# Patient Record
Sex: Male | Born: 1960
Health system: Southern US, Community
[De-identification: ages and names within clinical notes are randomized; demographics above are authoritative.]

## PROBLEM LIST (undated history)

## (undated) DIAGNOSIS — U071 COVID-19: Secondary | ICD-10-CM

## (undated) DIAGNOSIS — E669 Obesity, unspecified: Secondary | ICD-10-CM

## (undated) DIAGNOSIS — R0600 Dyspnea, unspecified: Secondary | ICD-10-CM

## (undated) DIAGNOSIS — R7303 Prediabetes: Secondary | ICD-10-CM

## (undated) DIAGNOSIS — F429 Obsessive-compulsive disorder, unspecified: Secondary | ICD-10-CM

## (undated) DIAGNOSIS — N200 Calculus of kidney: Secondary | ICD-10-CM

## (undated) DIAGNOSIS — E7800 Pure hypercholesterolemia, unspecified: Secondary | ICD-10-CM

## (undated) DIAGNOSIS — I10 Essential (primary) hypertension: Secondary | ICD-10-CM

## (undated) DIAGNOSIS — R748 Abnormal levels of other serum enzymes: Secondary | ICD-10-CM

## (undated) DIAGNOSIS — F909 Attention-deficit hyperactivity disorder, unspecified type: Secondary | ICD-10-CM

## (undated) DIAGNOSIS — N289 Disorder of kidney and ureter, unspecified: Secondary | ICD-10-CM

## (undated) DIAGNOSIS — G4730 Sleep apnea, unspecified: Secondary | ICD-10-CM

## (undated) DIAGNOSIS — R600 Localized edema: Secondary | ICD-10-CM

## (undated) HISTORY — DX: Prediabetes: R73.03

## (undated) HISTORY — PX: HERNIA REPAIR: SHX51

## (undated) HISTORY — DX: COVID-19: U07.1

## (undated) HISTORY — DX: Abnormal levels of other serum enzymes: R74.8

## (undated) HISTORY — DX: Obsessive-compulsive disorder, unspecified: F42.9

## (undated) HISTORY — DX: Obesity, unspecified: E66.9

## (undated) HISTORY — DX: Calculus of kidney: N20.0

## (undated) HISTORY — DX: Localized edema: R60.0

## (undated) HISTORY — DX: Dyspnea, unspecified: R06.00

---

## 2000-05-27 ENCOUNTER — Observation Stay (HOSPITAL_COMMUNITY): Admission: EM | Admit: 2000-05-27 | Discharge: 2000-05-28 | Payer: Self-pay | Admitting: Emergency Medicine

## 2000-05-27 ENCOUNTER — Encounter: Payer: Self-pay | Admitting: Emergency Medicine

## 2000-05-28 ENCOUNTER — Encounter: Payer: Self-pay | Admitting: Cardiology

## 2001-09-05 ENCOUNTER — Emergency Department (HOSPITAL_COMMUNITY): Admission: EM | Admit: 2001-09-05 | Discharge: 2001-09-05 | Payer: Self-pay | Admitting: Emergency Medicine

## 2001-09-05 ENCOUNTER — Encounter: Payer: Self-pay | Admitting: Emergency Medicine

## 2003-02-19 ENCOUNTER — Ambulatory Visit (HOSPITAL_COMMUNITY): Admission: RE | Admit: 2003-02-19 | Discharge: 2003-02-19 | Payer: Self-pay | Admitting: General Surgery

## 2005-06-14 ENCOUNTER — Ambulatory Visit: Payer: Self-pay | Admitting: Family Medicine

## 2007-01-07 ENCOUNTER — Ambulatory Visit: Payer: Self-pay | Admitting: Family Medicine

## 2014-09-06 ENCOUNTER — Encounter (HOSPITAL_BASED_OUTPATIENT_CLINIC_OR_DEPARTMENT_OTHER): Payer: Self-pay | Admitting: *Deleted

## 2014-09-08 NOTE — H&P (Signed)
  [facility] [address] [place],  [address] [phone] A Division of [facility]  Ninetta Lights, M.D.   Robert A. Noemi Chapel, M.D.   Faythe Casa, M.D.   Johnny Bridge, M.D.   Almedia Balls, M.D Ernesta Amble. Percell Miller, M.D.  Joseph Pierini, M.D.  Lanier Prude, M.D.    Verner Chol, M.D. Mary L. Fenton Malling, PA-C  Kirstin A. Shepperson, PA-C  Josh Warthen, PA-C [place], [place]   RE: Scott Wolfe, Scott Wolfe   [id number]      DOB: July 15, 1961 PROGRESS NOTE: 08-23-14 Reason for visit:  Follow-up left knee injury. History of present illness: In July he was trail runner and had a misstep and has had occasional swelling pain and pinching of his knee since then. Dr. Lynann Bologna obtained an MRI which demonstrated posterior horn medial meniscus tear that has a flap fragment that is displaced. 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   Please see associated documentation for this clinic visit for further past medical, family, surgical and social history, review of systems, and exam findings as this was reviewed by me.  EXAMINATION: Well appearing male in no apparent distress. He has tenderness in the left knee medial joint line stable to ligament exam mild effusion.   IMAGING: MRI results above.  ASSESSMENT/PLAN: 1. He has a posterior medial meniscus tear. 2. We will set him up for partial meniscectomy and possible chondroplasty.  Ernesta Amble.  Percell Miller, M.D.  Electronically verified by Ernesta Amble. Percell Miller, M.D. TDM:kah D 08-23-14 T 08-25-14

## 2014-09-09 ENCOUNTER — Ambulatory Visit (HOSPITAL_BASED_OUTPATIENT_CLINIC_OR_DEPARTMENT_OTHER)
Admission: RE | Admit: 2014-09-09 | Discharge: 2014-09-09 | Disposition: A | Discharge status: Home / Self Care | Payer: 59 | Source: Ambulatory Visit | Attending: Orthopedic Surgery | Admitting: Orthopedic Surgery

## 2014-09-09 DIAGNOSIS — F909 Attention-deficit hyperactivity disorder, unspecified type: Secondary | ICD-10-CM | POA: Diagnosis not present

## 2014-09-09 DIAGNOSIS — G4730 Sleep apnea, unspecified: Secondary | ICD-10-CM | POA: Diagnosis not present

## 2014-09-09 DIAGNOSIS — S8992XA Unspecified injury of left lower leg, initial encounter: Secondary | ICD-10-CM | POA: Diagnosis not present

## 2014-09-09 DIAGNOSIS — E780 Pure hypercholesterolemia: Secondary | ICD-10-CM | POA: Diagnosis not present

## 2014-09-09 DIAGNOSIS — Z6839 Body mass index (BMI) 39.0-39.9, adult: Secondary | ICD-10-CM | POA: Diagnosis not present

## 2014-09-09 DIAGNOSIS — M2242 Chondromalacia patellae, left knee: Secondary | ICD-10-CM | POA: Diagnosis not present

## 2014-09-09 DIAGNOSIS — S83242A Other tear of medial meniscus, current injury, left knee, initial encounter: Secondary | ICD-10-CM | POA: Diagnosis present

## 2014-09-09 DIAGNOSIS — I10 Essential (primary) hypertension: Secondary | ICD-10-CM | POA: Diagnosis not present

## 2014-09-09 LAB — BASIC METABOLIC PANEL
Anion gap: 7 (ref 5–15)
BUN: 18 mg/dL (ref 6–23)
CALCIUM: 9.1 mg/dL (ref 8.4–10.5)
CO2: 26 mmol/L (ref 19–32)
Chloride: 106 mEq/L (ref 96–112)
Creatinine, Ser: 1.12 mg/dL (ref 0.50–1.35)
GFR calc Af Amer: 84 mL/min — ABNORMAL LOW (ref >90)
GFR calc non Af Amer: 73 mL/min — ABNORMAL LOW (ref >90)
GLUCOSE: 89 mg/dL (ref 70–99)
Potassium: 4.6 mmol/L (ref 3.5–5.1)
Sodium: 139 mmol/L (ref 135–145)

## 2014-09-10 ENCOUNTER — Ambulatory Visit (HOSPITAL_BASED_OUTPATIENT_CLINIC_OR_DEPARTMENT_OTHER): Payer: 59 | Admitting: Anesthesiology

## 2014-09-10 ENCOUNTER — Ambulatory Visit (HOSPITAL_BASED_OUTPATIENT_CLINIC_OR_DEPARTMENT_OTHER)
Admission: RE | Admit: 2014-09-10 | Discharge: 2014-09-10 | Disposition: A | Discharge status: Home / Self Care | Payer: 59 | Source: Ambulatory Visit | Attending: Orthopedic Surgery | Admitting: Orthopedic Surgery

## 2014-09-10 ENCOUNTER — Encounter (HOSPITAL_BASED_OUTPATIENT_CLINIC_OR_DEPARTMENT_OTHER)
Admission: RE | Disposition: A | Discharge status: Home / Self Care | Payer: Self-pay | Source: Ambulatory Visit | Attending: Orthopedic Surgery

## 2014-09-10 ENCOUNTER — Encounter (HOSPITAL_BASED_OUTPATIENT_CLINIC_OR_DEPARTMENT_OTHER): Payer: Self-pay | Admitting: Anesthesiology

## 2014-09-10 DIAGNOSIS — F909 Attention-deficit hyperactivity disorder, unspecified type: Secondary | ICD-10-CM | POA: Insufficient documentation

## 2014-09-10 DIAGNOSIS — Z6839 Body mass index (BMI) 39.0-39.9, adult: Secondary | ICD-10-CM | POA: Insufficient documentation

## 2014-09-10 DIAGNOSIS — M2242 Chondromalacia patellae, left knee: Secondary | ICD-10-CM | POA: Insufficient documentation

## 2014-09-10 DIAGNOSIS — S8992XA Unspecified injury of left lower leg, initial encounter: Secondary | ICD-10-CM | POA: Insufficient documentation

## 2014-09-10 DIAGNOSIS — I10 Essential (primary) hypertension: Secondary | ICD-10-CM | POA: Insufficient documentation

## 2014-09-10 DIAGNOSIS — S83242A Other tear of medial meniscus, current injury, left knee, initial encounter: Secondary | ICD-10-CM | POA: Insufficient documentation

## 2014-09-10 DIAGNOSIS — G4730 Sleep apnea, unspecified: Secondary | ICD-10-CM | POA: Insufficient documentation

## 2014-09-10 DIAGNOSIS — E780 Pure hypercholesterolemia: Secondary | ICD-10-CM | POA: Insufficient documentation

## 2014-09-10 HISTORY — DX: Essential (primary) hypertension: I10

## 2014-09-10 HISTORY — PX: KNEE ARTHROSCOPY WITH MEDIAL MENISECTOMY: SHX5651

## 2014-09-10 HISTORY — DX: Pure hypercholesterolemia, unspecified: E78.00

## 2014-09-10 HISTORY — DX: Sleep apnea, unspecified: G47.30

## 2014-09-10 HISTORY — DX: Attention-deficit hyperactivity disorder, unspecified type: F90.9

## 2014-09-10 LAB — POCT HEMOGLOBIN-HEMACUE: Hemoglobin: 17.4 g/dL — ABNORMAL HIGH (ref 13.0–17.0)

## 2014-09-10 SURGERY — ARTHROSCOPY, KNEE, WITH MEDIAL MENISCECTOMY
Anesthesia: General | Site: Knee | Laterality: Left

## 2014-09-10 MED ORDER — CEFAZOLIN SODIUM 10 G IJ SOLR: 3.0000 g | INTRAMUSCULAR | Status: DC

## 2014-09-10 MED ORDER — HYDROMORPHONE HCL 1 MG/ML IJ SOLN: 0.2500 mg | INTRAMUSCULAR | Status: DC | PRN

## 2014-09-10 MED ORDER — FENTANYL CITRATE 0.05 MG/ML IJ SOLN
50.0000 ug | INTRAMUSCULAR | Status: DC | PRN
  Administered 2014-09-10: 100 ug via INTRAVENOUS

## 2014-09-10 MED ORDER — OXYCODONE HCL 5 MG PO TABS
ORAL_TABLET | ORAL | Status: AC
  Filled 2014-09-10: qty 1

## 2014-09-10 MED ORDER — CEFAZOLIN SODIUM-DEXTROSE 2-3 GM-% IV SOLR: INTRAVENOUS | Status: DC | PRN

## 2014-09-10 MED ORDER — ACETAMINOPHEN 500 MG PO TABS
1000.0000 mg | ORAL_TABLET | Freq: Once | ORAL | Status: AC
  Administered 2014-09-10: 1000 mg via ORAL

## 2014-09-10 MED ORDER — LIDOCAINE HCL (CARDIAC) 20 MG/ML IV SOLN
INTRAVENOUS | Status: DC | PRN
  Administered 2014-09-10: 80 mg via INTRAVENOUS

## 2014-09-10 MED ORDER — CEFAZOLIN SODIUM-DEXTROSE 2-3 GM-% IV SOLR
INTRAVENOUS | Status: DC | PRN
  Administered 2014-09-10: 3 g via INTRAVENOUS

## 2014-09-10 MED ORDER — MIDAZOLAM HCL 2 MG/2ML IJ SOLN
1.0000 mg | INTRAMUSCULAR | Status: DC | PRN
  Administered 2014-09-10: 2 mg via INTRAVENOUS

## 2014-09-10 MED ORDER — FENTANYL CITRATE 0.05 MG/ML IJ SOLN
INTRAMUSCULAR | Status: AC
  Filled 2014-09-10: qty 2

## 2014-09-10 MED ORDER — METHYLPREDNISOLONE ACETATE 80 MG/ML IJ SUSP
INTRAMUSCULAR | Status: DC | PRN
  Administered 2014-09-10: 80 mg

## 2014-09-10 MED ORDER — CEFAZOLIN SODIUM-DEXTROSE 2-3 GM-% IV SOLR
INTRAVENOUS | Status: AC
  Filled 2014-09-10: qty 50

## 2014-09-10 MED ORDER — DOCUSATE SODIUM 100 MG PO CAPS: 100.0000 mg | ORAL_CAPSULE | Freq: Two times a day (BID) | ORAL | Status: DC

## 2014-09-10 MED ORDER — ACETAMINOPHEN 500 MG PO TABS
ORAL_TABLET | ORAL | Status: AC
  Filled 2014-09-10: qty 2

## 2014-09-10 MED ORDER — CEFAZOLIN SODIUM 1-5 GM-% IV SOLN
INTRAVENOUS | Status: AC
  Filled 2014-09-10: qty 50

## 2014-09-10 MED ORDER — ONDANSETRON HCL 4 MG PO TABS: 4.0000 mg | ORAL_TABLET | Freq: Three times a day (TID) | ORAL | Status: DC | PRN

## 2014-09-10 MED ORDER — MIDAZOLAM HCL 5 MG/5ML IJ SOLN
INTRAMUSCULAR | Status: DC | PRN
  Administered 2014-09-10: 2 mg via INTRAVENOUS

## 2014-09-10 MED ORDER — MIDAZOLAM HCL 2 MG/2ML IJ SOLN
INTRAMUSCULAR | Status: AC
Start: 2014-09-10 — End: 2014-09-10
  Filled 2014-09-10: qty 2

## 2014-09-10 MED ORDER — FENTANYL CITRATE 0.05 MG/ML IJ SOLN
INTRAMUSCULAR | Status: AC
  Filled 2014-09-10: qty 6

## 2014-09-10 MED ORDER — BUPIVACAINE HCL (PF) 0.25 % IJ SOLN
INTRAMUSCULAR | Status: DC | PRN
  Administered 2014-09-10: 5 mL

## 2014-09-10 MED ORDER — OXYCODONE-ACETAMINOPHEN 5-325 MG PO TABS: 2.0000 | ORAL_TABLET | ORAL | Status: DC | PRN

## 2014-09-10 MED ORDER — OXYCODONE HCL 5 MG PO TABS
5.0000 mg | ORAL_TABLET | Freq: Once | ORAL | Status: AC | PRN
  Administered 2014-09-10: 5 mg via ORAL

## 2014-09-10 MED ORDER — DEXAMETHASONE SODIUM PHOSPHATE 10 MG/ML IJ SOLN
INTRAMUSCULAR | Status: DC | PRN
  Administered 2014-09-10: 10 mg via INTRAVENOUS

## 2014-09-10 MED ORDER — FENTANYL CITRATE 0.05 MG/ML IJ SOLN
INTRAMUSCULAR | Status: DC | PRN
  Administered 2014-09-10 (×2): 50 ug via INTRAVENOUS
  Administered 2014-09-10: 25 ug via INTRAVENOUS

## 2014-09-10 MED ORDER — OXYCODONE HCL 5 MG/5ML PO SOLN: 5.0000 mg | Freq: Once | ORAL | Status: AC | PRN

## 2014-09-10 MED ORDER — MIDAZOLAM HCL 2 MG/2ML IJ SOLN
INTRAMUSCULAR | Status: AC
  Filled 2014-09-10: qty 2

## 2014-09-10 MED ORDER — ASPIRIN 81 MG PO TABS: 81.0000 mg | ORAL_TABLET | Freq: Every day | ORAL | Status: AC

## 2014-09-10 MED ORDER — LACTATED RINGERS IV SOLN
INTRAVENOUS | Status: DC
  Administered 2014-09-10: 14:00:00 via INTRAVENOUS

## 2014-09-10 MED ORDER — DEXTROSE-NACL 5-0.45 % IV SOLN: 100.0000 mL/h | INTRAVENOUS | Status: DC

## 2014-09-10 MED ORDER — PROPOFOL 10 MG/ML IV BOLUS
INTRAVENOUS | Status: DC | PRN
  Administered 2014-09-10: 200 mg via INTRAVENOUS

## 2014-09-10 SURGICAL SUPPLY — 39 items
BANDAGE ELASTIC 6 VELCRO ST LF (GAUZE/BANDAGES/DRESSINGS) ×2 IMPLANT
BLADE CUDA 5.5 (BLADE) IMPLANT
BLADE CUDA GRT WHITE 3.5 (BLADE) IMPLANT
BLADE CUTTER GATOR 3.5 (BLADE) ×2 IMPLANT
BLADE CUTTER MENIS 5.5 (BLADE) IMPLANT
CANISTER SUCT 3000ML (MISCELLANEOUS) IMPLANT
CHLORAPREP W/TINT 26ML (MISCELLANEOUS) ×2 IMPLANT
CUTTER MENISCUS  4.2MM (BLADE)
CUTTER MENISCUS 4.2MM (BLADE) IMPLANT
DRAPE ARTHROSCOPY W/POUCH 90 (DRAPES) ×2 IMPLANT
DRAPE U 20/CS (DRAPES) ×2 IMPLANT
DRAPE U-SHAPE 47X51 STRL (DRAPES) ×2 IMPLANT
DRSG EMULSION OIL 3X3 NADH (GAUZE/BANDAGES/DRESSINGS) ×2 IMPLANT
GAUZE SPONGE 4X4 12PLY STRL (GAUZE/BANDAGES/DRESSINGS) ×3 IMPLANT
GLOVE BIO SURGEON STRL SZ 6.5 (GLOVE) ×1 IMPLANT
GLOVE BIO SURGEON STRL SZ7.5 (GLOVE) ×2 IMPLANT
GLOVE BIO SURGEON STRL SZ8 (GLOVE) IMPLANT
GLOVE BIOGEL PI IND STRL 7.0 (GLOVE) IMPLANT
GLOVE BIOGEL PI IND STRL 8 (GLOVE) ×1 IMPLANT
GLOVE BIOGEL PI IND STRL 8.5 (GLOVE) IMPLANT
GLOVE BIOGEL PI INDICATOR 7.0 (GLOVE) ×2
GLOVE BIOGEL PI INDICATOR 8 (GLOVE) ×1
GLOVE BIOGEL PI INDICATOR 8.5 (GLOVE)
GOWN STRL REUS W/ TWL LRG LVL3 (GOWN DISPOSABLE) ×3 IMPLANT
GOWN STRL REUS W/ TWL XL LVL3 (GOWN DISPOSABLE) ×1 IMPLANT
GOWN STRL REUS W/TWL LRG LVL3 (GOWN DISPOSABLE) ×2
GOWN STRL REUS W/TWL XL LVL3 (GOWN DISPOSABLE) ×2
KNEE WRAP E Z 3 GEL PACK (MISCELLANEOUS) ×1 IMPLANT
MANIFOLD NEPTUNE II (INSTRUMENTS) ×2 IMPLANT
PACK ARTHROSCOPY DSU (CUSTOM PROCEDURE TRAY) ×2 IMPLANT
PACK BASIN DAY SURGERY FS (CUSTOM PROCEDURE TRAY) ×2 IMPLANT
SET ARTHROSCOPY TUBING (MISCELLANEOUS) ×2
SET ARTHROSCOPY TUBING LN (MISCELLANEOUS) ×1 IMPLANT
SUT ETHILON 3 0 PS 1 (SUTURE) ×2 IMPLANT
TAPE CLOTH 3X10 TAN LF (GAUZE/BANDAGES/DRESSINGS) ×1 IMPLANT
TOWEL OR 17X24 6PK STRL BLUE (TOWEL DISPOSABLE) ×2 IMPLANT
TOWEL OR NON WOVEN STRL DISP B (DISPOSABLE) ×2 IMPLANT
WAND STAR VAC 90 (SURGICAL WAND) IMPLANT
WATER STERILE IRR 1000ML POUR (IV SOLUTION) ×2 IMPLANT

## 2014-09-10 NOTE — Discharge Instructions (Signed)
Remove dressings on Monday and ok to shower then, cover with bandaids after  Bear weight as tolerated   Post Anesthesia Home Care Instructions  Activity: Get plenty of rest for the remainder of the day. A responsible adult should stay with you for 24 hours following the procedure.  For the next 24 hours, DO NOT: -Drive a car -Paediatric nurse -Drink alcoholic beverages -Take any medication unless instructed by your physician -Make any legal decisions or sign important papers.  Meals: Start with liquid foods such as gelatin or soup. Progress to regular foods as tolerated. Avoid greasy, spicy, heavy foods. If nausea and/or vomiting occur, drink only clear liquids until the nausea and/or vomiting subsides. Call your physician if vomiting continues.  Special Instructions/Symptoms: Your throat may feel dry or sore from the anesthesia or the breathing tube placed in your throat during surgery. If this causes discomfort, gargle with warm salt water. The discomfort should disappear within 24 hours.   Regional Anesthesia Blocks  1. Numbness or the inability to move the "blocked" extremity may last from 3-48 hours after placement. The length of time depends on the medication injected and your individual response to the medication. If the numbness is not going away after 48 hours, call your surgeon.  2. The extremity that is blocked will need to be protected until the numbness is gone and the  Strength has returned. Because you cannot feel it, you will need to take extra care to avoid injury. Because it may be weak, you may have difficulty moving it or using it. You may not know what position it is in without looking at it while the block is in effect.  3. For blocks in the legs and feet, returning to weight bearing and walking needs to be done carefully. You will need to wait until the numbness is entirely gone and the strength has returned. You should be able to move your leg and foot normally  before you try and bear weight or walk. You will need someone to be with you when you first try to ensure you do not fall and possibly risk injury.  4. Bruising and tenderness at the needle site are common side effects and will resolve in a few days.  5. Persistent numbness or new problems with movement should be communicated to the surgeon or the [place] [phone] [place] [phone]).  Call your surgeon if you experience:   1.  Fever over 101.0. 2.  Inability to urinate. 3.  Nausea and/or vomiting. 4.  Extreme swelling or bruising at the surgical site. 5.  Continued bleeding from the incision. 6.  Increased pain, redness or drainage from the incision. 7.  Problems related to your pain medication. 8. Any change in color, movement and/or sensation 9. Any problems and/or concerns

## 2014-09-10 NOTE — Op Note (Signed)
09/10/2014  4:02 PM  PATIENT:  Scott Wolfe    PRE-OPERATIVE DIAGNOSIS:  chondromalcia patellae, left knee, other tear of medial meniscus, current injury, left knee  POST-OPERATIVE DIAGNOSIS:  Same  PROCEDURE:  LEFT KNEE ARTHROSCOPY WITH DEBRIDEMENT AND  MEDIAL MENISECTOMY  SURGEON:  Tycho Cheramie, D, MD  ASSISTANT: None  ANESTHESIA:   General  BLOOD LOSS: min  COMPLICATIONS: None   PREOPERATIVE INDICATIONS:  [name] is a  54 y.o. male with a diagnosis of chondromalcia patellae, left knee, other tear of medial meniscus, current injury, left knee who failed conservative measures and elected for surgical management.    The risks benefits and alternatives were discussed with the patient preoperatively including but not limited to the risks of infection, bleeding, nerve injury, cardiopulmonary complications, the need for revision surgery, among others, and the patient was willing to proceed.  OPERATIVE IMPLANTS: none  OPERATIVE FINDINGS: Examination under anesthesia: stable Diagnostic Arthroscopy:  articular cartilage:grad 2 PF and Medial  Medial meniscus:2 parrot beak tears Lateral meniscus:stable Anterior cruciate ligament/PCL: intact Loose bodies: none    OPERATIVE PROCEDURE:  Patient was identified in the preoperative holding area and site was marked by me male was transported to the operating theater and placed on the table in supine position taking care to pad all bony prominences. After a preincinduction time out anesthesia was induced.  male received ancef for preoperative antibiotics. The left extremity was prepped and draped in normal sterile fashion and a pre-incision timeout was performed.   A small stab incision was made in the anterolateral portal position. The arthroscope was introduced in the joint. A medial portal was then established under direct visualization just above the anterior horn of the medial meniscus. Diagnostic arthroscopy was then carried out  with findings as described above.  I performed a chondroplasty at the patellofemoral space there was grade 2 chondral changes. In the notch  In the notch anterior cruciate ligament and PCL were without pathology.  On the lateral side meniscus was intact grade 1 changes to the cartilage. On the medial side there was an anterior parrot-beak tear that I debrided to a stable rim there were grade 2 changes throughout the tibia I performed a chondroplasty here. The posterior at the posterior horn there was a large parrot beak tear that was displaceable I debrided this back to a stable rim.    The arthroscopic equipment was removed from the joint and the portals were closed with 3-0 nylon in an interrupted fashion. The knee was infiltrated with depomedrol and marcain 5cc.  Sterile dressings were then applied including Xeroform 4 x 4's ABDs an ACE bandage.  The patient was then allowed to awaken from general anesthesia, transferred to the stretcher and taken to the recovery room in stable condition.  POSTOPERATIVE PLAN: The patient will be discharged home today and will followup in one week for suture removal and wound check.  VTE prophylaxis: ambulation and ASA 81   This note was generated using a template and dragon dictation system. In light of that, I have reviewed the note and all aspects of it are applicable to this case. Any dictation errors are due to the computerized dictation system.

## 2014-09-10 NOTE — Transfer of Care (Signed)
Immediate Anesthesia Transfer of Care Note  Patient: Scott Wolfe  Procedure(s) Performed: Procedure(s): LEFT KNEE ARTHROSCOPY WITH DEBRIDEMENT AND  MEDIAL MENISECTOMY (Left)  Patient Location: PACU  Anesthesia Type:GA combined with regional for post-op pain  Level of Consciousness: awake, alert  and patient cooperative  Airway & Oxygen Therapy: Patient Spontanous Breathing and Patient connected to face mask oxygen  Post-op Assessment: Report given to PACU RN and Post -op Vital signs reviewed and stable  Post vital signs: Reviewed and stable  Complications: No apparent anesthesia complications

## 2014-09-10 NOTE — Progress Notes (Signed)
Assisted Dr. Linna Caprice with left, ultrasound guided, femoral block. Side rails up, monitors on throughout procedure. See vital signs in flow sheet. Tolerated Procedure well.

## 2014-09-10 NOTE — Anesthesia Postprocedure Evaluation (Signed)
  Anesthesia Post-op Note  Patient: Scott Wolfe  Procedure(s) Performed: Procedure(s): LEFT KNEE ARTHROSCOPY WITH DEBRIDEMENT AND  MEDIAL MENISECTOMY (Left)  Patient Location: PACU  Anesthesia Type:General and GA combined with regional for post-op pain  Level of Consciousness: awake, alert  and oriented  Airway and Oxygen Therapy: Patient Spontanous Breathing and Patient connected to nasal cannula oxygen  Post-op Pain: mild  Post-op Assessment: Post-op Vital signs reviewed, Patient's Cardiovascular Status Stable, Respiratory Function Stable, Patent Airway, No signs of Nausea or vomiting and Pain level controlled  Post-op Vital Signs: stable  Last Vitals:  Filed Vitals:   09/10/14 1656  BP: 133/80  Pulse: 81  Temp: 36.7 C  Resp: 18    Complications: No apparent anesthesia complications

## 2014-09-10 NOTE — Anesthesia Procedure Notes (Addendum)
Procedure Name: LMA Insertion Date/Time: 09/10/2014 3:17 PM Performed by: Toula Moos L Pre-anesthesia Checklist: Patient identified, Emergency Drugs available, Suction available, Patient being monitored and Timeout performed Patient Re-evaluated:Patient Re-evaluated prior to inductionOxygen Delivery Method: Circle System Utilized Preoxygenation: Pre-oxygenation with 100% oxygen Intubation Type: IV induction Ventilation: Mask ventilation without difficulty LMA: LMA inserted LMA Size: 5.0 Number of attempts: 1 Airway Equipment and Method: bite block Placement Confirmation: positive ETCO2 Tube secured with: Tape Dental Injury: Teeth and Oropharynx as per pre-operative assessment    Anesthesia Regional Block:  Adductor canal block  Pre-Anesthetic Checklist: ,, timeout performed, Correct Patient, Correct Site, Correct Laterality, Correct Procedure, Correct Position, site marked, Risks and benefits discussed,  Surgical consent,  Pre-op evaluation,  At surgeon's request and post-op pain management  Laterality: Left  Prep: chloraprep       Needles:  Injection technique: Single-shot  Needle Type: Echogenic Stimulator Needle     Needle Length: 9cm 9 cm Needle Gauge: 22 and 22 G    Additional Needles:  Procedures: ultrasound guided (picture in chart) Adductor canal block Narrative:  Start time: 09/10/2014 2:30 PM End time: 09/10/2014 2:35 PM Injection made incrementally with aspirations every 5 mL.  Performed by: Personally   Additional Notes: 30 cc 0.5% Marcaine with 1:200 Epi injected easily

## 2014-09-10 NOTE — Anesthesia Preprocedure Evaluation (Signed)
Anesthesia Evaluation  Patient identified by MRN, date of birth, ID band Patient awake    Reviewed: Allergy & Precautions, H&P , NPO status , Patient's Chart, lab work & pertinent test results  Airway        Dental no notable dental hx.    Pulmonary sleep apnea ,    Pulmonary exam normal       Cardiovascular hypertension, On Medications negative cardio ROS      Neuro/Psych negative neurological ROS     GI/Hepatic negative GI ROS, Neg liver ROS,   Endo/Other  Morbid obesity  Renal/GU negative Renal ROS  negative genitourinary   Musculoskeletal   Abdominal   Peds  (+) ADHD Hematology negative hematology ROS (+)   Anesthesia Other Findings   Reproductive/Obstetrics negative OB ROS                             Anesthesia Physical Anesthesia Plan  ASA: III  Anesthesia Plan: General   Post-op Pain Management:    Induction: Intravenous  Airway Management Planned: LMA  Additional Equipment:   Intra-op Plan:   Post-operative Plan: Extubation in OR  Informed Consent: I have reviewed the patients History and Physical, chart, labs and discussed the procedure including the risks, benefits and alternatives for the proposed anesthesia with the patient or authorized representative who has indicated his/her understanding and acceptance.   Dental advisory given  Plan Discussed with: CRNA  Anesthesia Plan Comments:         Anesthesia Quick Evaluation

## 2014-09-13 ENCOUNTER — Encounter (HOSPITAL_BASED_OUTPATIENT_CLINIC_OR_DEPARTMENT_OTHER): Payer: Self-pay | Admitting: Orthopedic Surgery

## 2017-01-20 ENCOUNTER — Encounter (HOSPITAL_COMMUNITY): Payer: Self-pay | Admitting: Emergency Medicine

## 2017-01-20 ENCOUNTER — Emergency Department (HOSPITAL_COMMUNITY): Payer: Commercial Managed Care - HMO

## 2017-01-20 ENCOUNTER — Emergency Department (HOSPITAL_COMMUNITY)
Admission: EM | Admit: 2017-01-20 | Discharge: 2017-01-20 | Disposition: A | Discharge status: Home / Self Care | Payer: Commercial Managed Care - HMO | Attending: Emergency Medicine | Admitting: Emergency Medicine

## 2017-01-20 DIAGNOSIS — I10 Essential (primary) hypertension: Secondary | ICD-10-CM | POA: Diagnosis not present

## 2017-01-20 DIAGNOSIS — N132 Hydronephrosis with renal and ureteral calculous obstruction: Secondary | ICD-10-CM | POA: Diagnosis not present

## 2017-01-20 DIAGNOSIS — Z79899 Other long term (current) drug therapy: Secondary | ICD-10-CM | POA: Insufficient documentation

## 2017-01-20 DIAGNOSIS — N1330 Unspecified hydronephrosis: Secondary | ICD-10-CM | POA: Diagnosis not present

## 2017-01-20 DIAGNOSIS — R109 Unspecified abdominal pain: Secondary | ICD-10-CM

## 2017-01-20 DIAGNOSIS — N201 Calculus of ureter: Secondary | ICD-10-CM | POA: Diagnosis not present

## 2017-01-20 DIAGNOSIS — Z7982 Long term (current) use of aspirin: Secondary | ICD-10-CM | POA: Insufficient documentation

## 2017-01-20 DIAGNOSIS — F909 Attention-deficit hyperactivity disorder, unspecified type: Secondary | ICD-10-CM | POA: Diagnosis not present

## 2017-01-20 HISTORY — DX: Disorder of kidney and ureter, unspecified: N28.9

## 2017-01-20 LAB — BASIC METABOLIC PANEL
Anion gap: 7 (ref 5–15)
BUN: 11 mg/dL (ref 6–20)
CALCIUM: 9.9 mg/dL (ref 8.9–10.3)
CO2: 27 mmol/L (ref 22–32)
Chloride: 107 mmol/L (ref 101–111)
Creatinine, Ser: 1.19 mg/dL (ref 0.61–1.24)
GFR calc non Af Amer: >60 mL/min (ref >60)
Glucose, Bld: 125 mg/dL — ABNORMAL HIGH (ref 65–99)
Potassium: 4.8 mmol/L (ref 3.5–5.1)
Sodium: 141 mmol/L (ref 135–145)

## 2017-01-20 LAB — URINALYSIS, ROUTINE W REFLEX MICROSCOPIC
Bacteria, UA: NONE SEEN
Bilirubin Urine: NEGATIVE
Glucose, UA: NEGATIVE mg/dL
Ketones, ur: NEGATIVE mg/dL
Leukocytes, UA: NEGATIVE
Nitrite: NEGATIVE
PH: 6 (ref 5.0–8.0)
Protein, ur: 30 mg/dL — AB
SQUAMOUS EPITHELIAL / LPF: NONE SEEN
Specific Gravity, Urine: 1.012 (ref 1.005–1.030)

## 2017-01-20 LAB — CBC WITH DIFFERENTIAL/PLATELET
Basophils Absolute: 0 10*3/uL (ref 0.0–0.1)
Basophils Relative: 0 %
Eosinophils Absolute: 0 10*3/uL (ref 0.0–0.7)
Eosinophils Relative: 0 %
HCT: 47.9 % (ref 39.0–52.0)
Hemoglobin: 17.4 g/dL — ABNORMAL HIGH (ref 13.0–17.0)
Lymphocytes Relative: 7 %
Lymphs Abs: 1 10*3/uL (ref 0.7–4.0)
MCH: 34.3 pg — ABNORMAL HIGH (ref 26.0–34.0)
MCHC: 36.3 g/dL — ABNORMAL HIGH (ref 30.0–36.0)
MCV: 94.5 fL (ref 78.0–100.0)
Monocytes Absolute: 0.8 10*3/uL (ref 0.1–1.0)
Monocytes Relative: 6 %
Neutro Abs: 12.9 10*3/uL — ABNORMAL HIGH (ref 1.7–7.7)
Neutrophils Relative %: 87 %
Platelets: 109 10*3/uL — ABNORMAL LOW (ref 150–400)
RBC: 5.07 MIL/uL (ref 4.22–5.81)
RDW: 12.8 % (ref 11.5–15.5)
WBC: 14.7 10*3/uL — ABNORMAL HIGH (ref 4.0–10.5)

## 2017-01-20 MED ORDER — HYDROMORPHONE HCL 1 MG/ML IJ SOLN
1.0000 mg | Freq: Once | INTRAMUSCULAR | Status: AC
  Administered 2017-01-20: 1 mg via INTRAVENOUS

## 2017-01-20 MED ORDER — HYDROMORPHONE HCL 1 MG/ML IJ SOLN
1.0000 mg | Freq: Once | INTRAMUSCULAR | Status: AC
  Administered 2017-01-20: 1 mg via INTRAVENOUS
  Filled 2017-01-20: qty 1

## 2017-01-20 MED ORDER — HYDROCODONE-ACETAMINOPHEN 5-325 MG PO TABS: 1.0000 | ORAL_TABLET | Freq: Four times a day (QID) | ORAL | 0 refills | Status: DC | PRN

## 2017-01-20 MED ORDER — SODIUM CHLORIDE 0.9 % IV SOLN
INTRAVENOUS | Status: DC
  Administered 2017-01-20: 13:00:00 via INTRAVENOUS

## 2017-01-20 MED ORDER — HYDROMORPHONE HCL 1 MG/ML IJ SOLN
INTRAMUSCULAR | Status: AC
  Administered 2017-01-20: 1 mg via INTRAVENOUS
  Filled 2017-01-20: qty 1

## 2017-01-20 MED ORDER — SODIUM CHLORIDE 0.9 % IV BOLUS (SEPSIS)
1000.0000 mL | Freq: Once | INTRAVENOUS | Status: AC
Start: 2017-01-20 — End: 2017-01-20
  Administered 2017-01-20: 1000 mL via INTRAVENOUS

## 2017-01-20 MED ORDER — ONDANSETRON HCL 4 MG/2ML IJ SOLN
4.0000 mg | Freq: Once | INTRAMUSCULAR | Status: AC
  Administered 2017-01-20: 4 mg via INTRAVENOUS
  Filled 2017-01-20: qty 2

## 2017-01-20 MED ORDER — ONDANSETRON 4 MG PO TBDP: 4.0000 mg | ORAL_TABLET | Freq: Three times a day (TID) | ORAL | 1 refills | Status: DC | PRN

## 2017-01-20 NOTE — Discharge Instructions (Signed)
Take pain medicine as directed. Follow-up with urology if not improved. Take antinausea medicine as needed. Work note provided. Return for any new or worse symptoms. Would expect you passed a kidney stone sometime in the next 2 days.

## 2017-01-20 NOTE — ED Provider Notes (Signed)
[name] DEPT Provider Note   CSN: [id number] Arrival date & time: 01/20/17  1041  By signing my name below, I, Scott Wolfe, attest that this documentation has been prepared under the direction and in the presence of [name], MD. Electronically Signed: Hansel Wolfe, ED Scribe. 01/20/17. 11:41 AM.    History   Chief Complaint Chief Complaint  Patient presents with  . Flank Pain    HPI Scott Wolfe is a 56 y.o. male with h/o kidney stones who presents to the Emergency Department complaining of moderate left flank pain with radiation to the left groin that began 3 days ago and worsened recently. He reports associated nausea, urgency. Pt states his current symptoms are similar to prior kidney stones. No worsening or alleviating factors noted. He also notes mild cough. Pt denies fever, congestion, rhinorrhea, sore throat, visual disturbance, SOB, CP, leg swelling, diarrhea, vomiting, dysuria, hematuria, neck pain, rash, HA, bruising/bleeding easily. Pt is not currently on anticoagulants.    The history is provided by the patient. No language interpreter was used.  Flank Pain  This is a new problem. The current episode started more than 2 days ago. The problem occurs constantly. The problem has been gradually worsening. Associated symptoms include abdominal pain. Pertinent negatives include no chest pain, no headaches and no shortness of breath. Nothing aggravates the symptoms. Nothing relieves the symptoms. He has tried nothing for the symptoms. The treatment provided no relief.    Past Medical History:  Diagnosis Date  . ADHD (attention deficit hyperactivity disorder)   . Hypercholesterolemia   . Hypertension   . Renal disorder   . Sleep apnea    uses CPAP nightly    There are no active problems to display for this patient.   Past Surgical History:  Procedure Laterality Date  . HERNIA REPAIR    . KNEE ARTHROSCOPY WITH MEDIAL MENISECTOMY Left 09/10/2014   Procedure:  LEFT KNEE ARTHROSCOPY WITH DEBRIDEMENT AND  MEDIAL MENISECTOMY;  Surgeon: Renette Butters, MD;  Location: [facility];  Service: Orthopedics;  Laterality: Left;       Home Medications    Prior to Admission medications   Medication Sig Start Date End Date Taking? Authorizing Provider  amphetamine-dextroamphetamine (ADDERALL XR) 30 MG 24 hr capsule Take 30 mg by mouth 2 (two) times daily.   Yes [provider]  aspirin 81 MG tablet Take 1 tablet (81 mg total) by mouth daily. 09/10/14  Yes Renette Butters, MD  cetirizine (ZYRTEC) 10 MG tablet Take 10 mg by mouth daily as needed for allergies.   Yes [provider]  fluticasone (FLONASE) 50 MCG/ACT nasal spray Place 2 sprays into both nostrils daily as needed for allergies or rhinitis.   Yes [provider]  Glucosamine-Chondroitin (OSTEO BI-FLEX REGULAR STRENGTH PO) Take 1 tablet by mouth daily.   Yes [provider]  lisinopril (PRINIVIL,ZESTRIL) 10 MG tablet Take 10 mg by mouth daily.   Yes [provider]  Multiple Vitamin (MULTIVITAMIN WITH MINERALS) TABS tablet Take 1 tablet by mouth daily.   Yes [provider]  rosuvastatin (CRESTOR) 10 MG tablet Take 10 mg by mouth at bedtime.    Yes [provider]  HYDROcodone-acetaminophen (NORCO/VICODIN) 5-325 MG tablet Take 1-2 tablets by mouth every 6 (six) hours as needed. 01/20/17   [name], MD  ondansetron (ZOFRAN ODT) 4 MG disintegrating tablet Take 1 tablet (4 mg total) by mouth every 8 (eight) hours as needed. 01/20/17   Shameek Nyquist,  Nicki Reaper, MD  oxyCODONE-acetaminophen (ROXICET) 5-325 MG per tablet Take 2 tablets by mouth every 4 (four) hours as needed. 09/10/14   Renette Butters, MD    Family History History reviewed. No pertinent family history.  Social History Social History  Substance Use Topics  . Smoking status: Never Smoker  . Smokeless tobacco: Never Used  . Alcohol use Yes     Comment: social      Allergies   Patient has no known allergies.   Review of Systems Review of Systems  Constitutional: Negative for fever.  HENT: Negative for congestion, rhinorrhea and sore throat.   Eyes: Negative for visual disturbance.  Respiratory: Positive for cough. Negative for shortness of breath.   Cardiovascular: Negative for chest pain and leg swelling.  Gastrointestinal: Positive for abdominal pain and nausea. Negative for diarrhea and vomiting.  Genitourinary: Positive for flank pain and urgency. Negative for dysuria and hematuria.  Musculoskeletal: Positive for back pain. Negative for neck pain.  Skin: Negative for rash.  Neurological: Negative for headaches.  Hematological: Does not bruise/bleed easily.  Psychiatric/Behavioral: Negative for confusion.     Physical Exam Updated Vital Signs BP (!) 189/109   Pulse 92   Temp 97.7 F (36.5 C) (Oral)   Resp 16   Ht 6' (1.829 m)   Wt 290 lb (131.5 kg)   SpO2 98%   BMI 39.33 kg/m   Physical Exam  Constitutional: He is oriented to person, place, and time. He appears well-developed and well-nourished.  HENT:  Head: Normocephalic.  Mucous membranes slightly dry.   Eyes: Conjunctivae and EOM are normal. Pupils are equal, round, and reactive to light. No scleral icterus.  Eyes tracking normally. Sclera clear.   Cardiovascular: Normal rate, regular rhythm and normal heart sounds.   Pulmonary/Chest: Effort normal and breath sounds normal. No respiratory distress. He has no wheezes. He has no rales.  Abdominal: Soft. Bowel sounds are normal. He exhibits no distension. There is no tenderness.  No CVA or flank tenderness.   Musculoskeletal: Normal range of motion.  Neurological: He is alert and oriented to person, place, and time. No cranial nerve deficit or sensory deficit. He exhibits normal muscle tone. Coordination normal.  Skin: Skin is warm and dry.  Psychiatric: He has a normal mood and affect. His behavior is normal.   Nursing note and vitals reviewed.    ED Treatments / Results   DIAGNOSTIC STUDIES: Oxygen Saturation is 100% on RA, normal by my interpretation.    COORDINATION OF CARE: 11:39 AM Discussed treatment plan with pt at bedside which includes UA, CT and pt agreed to plan.    Labs (all labs ordered are listed, but only abnormal results are displayed) Labs Reviewed  URINALYSIS, ROUTINE W REFLEX MICROSCOPIC - Abnormal; Notable for the following:       Result Value   Color, Urine STRAW (*)    Hgb urine dipstick SMALL (*)    Protein, ur 30 (*)    All other components within normal limits  CBC WITH DIFFERENTIAL/PLATELET - Abnormal; Notable for the following:    WBC 14.7 (*)    Hemoglobin 17.4 (*)    MCH 34.3 (*)    MCHC 36.3 (*)    Platelets 109 (*)    Neutro Abs 12.9 (*)    All other components within normal limits  BASIC METABOLIC PANEL - Abnormal; Notable for the following:    Glucose, Bld 125 (*)    All other components within normal limits  Results for orders placed or performed during the hospital encounter of 01/20/17  Urinalysis, Routine w reflex microscopic- may I&O cath if menses  Result Value Ref Range   Color, Urine STRAW (A) YELLOW   APPearance CLEAR CLEAR   Specific Gravity, Urine 1.012 1.005 - 1.030   pH 6.0 5.0 - 8.0   Glucose, UA NEGATIVE NEGATIVE mg/dL   Hgb urine dipstick SMALL (A) NEGATIVE   Bilirubin Urine NEGATIVE NEGATIVE   Ketones, ur NEGATIVE NEGATIVE mg/dL   Protein, ur 30 (A) NEGATIVE mg/dL   Nitrite NEGATIVE NEGATIVE   Leukocytes, UA NEGATIVE NEGATIVE   RBC / HPF 6-30 0 - 5 RBC/hpf   WBC, UA 0-5 0 - 5 WBC/hpf   Bacteria, UA NONE SEEN NONE SEEN   Squamous Epithelial / LPF NONE SEEN NONE SEEN  CBC with Differential/Platelet  Result Value Ref Range   WBC 14.7 (H) 4.0 - 10.5 K/uL   RBC 5.07 4.22 - 5.81 MIL/uL   Hemoglobin 17.4 (H) 13.0 - 17.0 g/dL   HCT 47.9 39.0 - 52.0 %   MCV 94.5 78.0 - 100.0 fL   MCH 34.3 (H) 26.0 - 34.0 pg   MCHC 36.3  (H) 30.0 - 36.0 g/dL   RDW 12.8 11.5 - 15.5 %   Platelets 109 (L) 150 - 400 K/uL   Neutrophils Relative % 87 %   Neutro Abs 12.9 (H) 1.7 - 7.7 K/uL   Lymphocytes Relative 7 %   Lymphs Abs 1.0 0.7 - 4.0 K/uL   Monocytes Relative 6 %   Monocytes Absolute 0.8 0.1 - 1.0 K/uL   Eosinophils Relative 0 %   Eosinophils Absolute 0.0 0.0 - 0.7 K/uL   Basophils Relative 0 %   Basophils Absolute 0.0 0.0 - 0.1 K/uL  Basic metabolic panel  Result Value Ref Range   Sodium 141 135 - 145 mmol/L   Potassium 4.8 3.5 - 5.1 mmol/L   Chloride 107 101 - 111 mmol/L   CO2 27 22 - 32 mmol/L   Glucose, Bld 125 (H) 65 - 99 mg/dL   BUN 11 6 - 20 mg/dL   Creatinine, Ser 1.19 0.61 - 1.24 mg/dL   Calcium 9.9 8.9 - 10.3 mg/dL   GFR calc non Af Amer >60 >60 mL/min   GFR calc Af Amer >60 >60 mL/min   Anion gap 7 5 - 15     EKG  EKG Interpretation None       Radiology Ct Renal Stone Study  Result Date: 01/20/2017 CLINICAL DATA:  Left flank pain. EXAM: CT ABDOMEN AND PELVIS WITHOUT CONTRAST TECHNIQUE: Multidetector CT imaging of the abdomen and pelvis was performed following the standard protocol without IV contrast. COMPARISON:  March 23, 2004 FINDINGS: Lower chest: The distal esophageal wall is prominent in caliber such as on series 2, image 19. No focal mass identified. Coronary artery calcifications are seen. The lung bases are otherwise normal Hepatobiliary: Hepatic steatosis.  The gallbladder is normal. Pancreas: Unremarkable. No pancreatic ductal dilatation or surrounding inflammatory changes. Spleen: Normal in size without focal abnormality. Adrenals/Urinary Tract: The adrenal glands are normal. No renal stones identified. A few small renal cysts are noted. No hydronephrosis, ureterectasis, or ureteral stone seen on the right. There is increased perinephric stranding on the left with mild hydronephrosis and ureterectasis. There is a 3.7 mm stone in the proximal left ureter on image 60. There is a 5 mm stone  just within the bladder near the left UVJ. The bladder is otherwise normal.  Stomach/Bowel: The stomach and small bowel are normal. The: And appendix are normal. Vascular/Lymphatic: No significant vascular findings are present. No enlarged abdominal or pelvic lymph nodes. Reproductive: Prostate is unremarkable. Other: There is a fat containing left inguinal hernia. There is a fat containing ventral hernia on image 67. Musculoskeletal: No acute or significant osseous findings. IMPRESSION: 1. There is a 3.7 mm stone in the proximal left ureter and a 5 mm stone within the bladder, adjacent to the left UVJ. These stones explain the mild left-sided hydronephrosis, increased perinephric stranding, and left ureterectasis. 2. The distal esophageal wall is prominent in caliber. While this is nonspecific, it could represent esophagitis. Electronically Signed   By: Dorise Bullion III M.D   On: 01/20/2017 12:56    Procedures Procedures (including critical care time)  Medications Ordered in ED Medications  0.9 %  sodium chloride infusion (not administered)  sodium chloride 0.9 % bolus 1,000 mL (1,000 mLs Intravenous New Bag/Given 01/20/17 1159)  HYDROmorphone (DILAUDID) injection 1 mg (1 mg Intravenous Given 01/20/17 1158)  ondansetron (ZOFRAN) injection 4 mg (4 mg Intravenous Given 01/20/17 1158)  HYDROmorphone (DILAUDID) injection 1 mg (1 mg Intravenous Given 01/20/17 1254)     Initial Impression / Assessment and Plan / ED Course  I have reviewed the triage vital signs and the nursing notes.  Pertinent labs & imaging results that were available during my care of the patient were reviewed by me and considered in my medical decision making (see chart for details).    History and clinical presentation consistent with left-sided kidney stone. CT scan confirms this. Appears that there was 21 has passed into the bladder and the other is in the proximal ureter. Stone size is less than 6 mm most likely will be  passable. Patient will be treated with the pain medication and antinausea medicine and follow-up with urology as needed. Patient will return for any new or worse symptoms. Work note provided. Patient nontoxic no acute distress.   Final Clinical Impressions(s) / ED Diagnoses   Final diagnoses:  Left flank pain  Ureteral stone with hydronephrosis    New Prescriptions New Prescriptions   HYDROCODONE-ACETAMINOPHEN (NORCO/VICODIN) 5-325 MG TABLET    Take 1-2 tablets by mouth every 6 (six) hours as needed.   ONDANSETRON (ZOFRAN ODT) 4 MG DISINTEGRATING TABLET    Take 1 tablet (4 mg total) by mouth every 8 (eight) hours as needed.   I personally performed the services described in this documentation, which was scribed in my presence. The recorded information has been reviewed and is accurate.      [name], MD 01/20/17 [phone]

## 2017-01-20 NOTE — ED Triage Notes (Signed)
Patient c/o left flank pain that started on Thursday and progressively getting worse. Per patient pain radiates into left lower abd/groin. Hx of kidney stones. Nausea but no vomiting. Denies any diarrhea or fevers. Urgency but decrease in urinary flow.

## 2017-03-26 DIAGNOSIS — J209 Acute bronchitis, unspecified: Secondary | ICD-10-CM | POA: Diagnosis not present

## 2017-06-12 DIAGNOSIS — J309 Allergic rhinitis, unspecified: Secondary | ICD-10-CM | POA: Diagnosis not present

## 2018-01-02 DIAGNOSIS — I10 Essential (primary) hypertension: Secondary | ICD-10-CM | POA: Diagnosis not present

## 2018-01-02 DIAGNOSIS — R7309 Other abnormal glucose: Secondary | ICD-10-CM | POA: Diagnosis not present

## 2018-01-02 DIAGNOSIS — G4733 Obstructive sleep apnea (adult) (pediatric): Secondary | ICD-10-CM | POA: Diagnosis not present

## 2018-01-28 DIAGNOSIS — I10 Essential (primary) hypertension: Secondary | ICD-10-CM | POA: Diagnosis not present

## 2018-01-29 ENCOUNTER — Encounter: Payer: 59 | Attending: Family Medicine | Admitting: Skilled Nursing Facility1

## 2018-01-29 ENCOUNTER — Encounter: Payer: Self-pay | Admitting: Skilled Nursing Facility1

## 2018-01-29 DIAGNOSIS — Z713 Dietary counseling and surveillance: Secondary | ICD-10-CM | POA: Insufficient documentation

## 2018-01-29 DIAGNOSIS — E669 Obesity, unspecified: Secondary | ICD-10-CM

## 2018-01-29 DIAGNOSIS — Z6841 Body Mass Index (BMI) 40.0 and over, adult: Secondary | ICD-10-CM | POA: Insufficient documentation

## 2018-01-29 NOTE — Progress Notes (Signed)
  Assessment:  Primary concerns today: obesity.   Pt states he wears his C-PAP every night. Pt had a complete graph of his weight since 2010 which was about 245pounds and it has now steadily increase to 302 pounds. Pt states he now has a sedentary job with a lot of stress.Pt identified his behavior of using Food as a coping mechanism and as a reward. Pt states he has become more aware of that behavior. Pt states on fridays he rewards himself with dessert. Pt states work is very stressful. Pt states he has children and is divorced for about 13 years. 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  Pt states he is taking a vacation in June to [place] and [place] state to see his brother and sister.  MEDICATIONS: See List   DIETARY INTAKE:  24-hr recall:  B (6 AM): 2 cups of coffee and 2 packs of oatmeal  Snk ( AM):  L ( PM): skipped  Snk ( PM):  D ( 8 PM): fast food or chicken from [employer] or a couple egg rolls and chinese  Snk ( PM):  Beverages:   Usual physical activity: ADl's  Estimated energy needs: 2000 calories 225 g carbohydrates 150 g protein 56 g fat  Progress Towards Goal(s):  In progress.  Intervention:  Nutrition counseling for weight management. Goals: -Set an alarm to eat lunch -Work on your behaviors surrounding why you eat -Look into reading the intuitive eating book -Attempt to be present and in the moment by mindfully eating  Teaching Method Utilized:  Visual Auditory Hands on  Handouts given during visit include:  Meal ideas  Emotions/needs sheets  Should I eat flow sheet  Barriers to learning/adherence to lifestyle change: emotional eating  Demonstrated degree of understanding via:  Teach Back   Monitoring/Evaluation:  Dietary intake, exercise,and body weight prn.

## 2018-03-10 DIAGNOSIS — G4733 Obstructive sleep apnea (adult) (pediatric): Secondary | ICD-10-CM | POA: Diagnosis not present

## 2018-03-10 DIAGNOSIS — H15001 Unspecified scleritis, right eye: Secondary | ICD-10-CM | POA: Diagnosis not present

## 2018-03-18 DIAGNOSIS — H15001 Unspecified scleritis, right eye: Secondary | ICD-10-CM | POA: Diagnosis not present

## 2018-03-24 DIAGNOSIS — H15001 Unspecified scleritis, right eye: Secondary | ICD-10-CM | POA: Diagnosis not present

## 2018-03-24 DIAGNOSIS — I10 Essential (primary) hypertension: Secondary | ICD-10-CM | POA: Diagnosis not present

## 2018-04-01 DIAGNOSIS — G4733 Obstructive sleep apnea (adult) (pediatric): Secondary | ICD-10-CM | POA: Diagnosis not present

## 2018-04-11 DIAGNOSIS — G4733 Obstructive sleep apnea (adult) (pediatric): Secondary | ICD-10-CM | POA: Diagnosis not present

## 2018-04-29 DIAGNOSIS — E785 Hyperlipidemia, unspecified: Secondary | ICD-10-CM | POA: Diagnosis not present

## 2018-04-29 DIAGNOSIS — Z23 Encounter for immunization: Secondary | ICD-10-CM | POA: Diagnosis not present

## 2018-04-29 DIAGNOSIS — Z0000 Encounter for general adult medical examination without abnormal findings: Secondary | ICD-10-CM | POA: Diagnosis not present

## 2018-04-29 DIAGNOSIS — Z125 Encounter for screening for malignant neoplasm of prostate: Secondary | ICD-10-CM | POA: Diagnosis not present

## 2018-05-09 ENCOUNTER — Other Ambulatory Visit: Payer: Self-pay | Admitting: Family Medicine

## 2018-05-09 DIAGNOSIS — R945 Abnormal results of liver function studies: Principal | ICD-10-CM

## 2018-05-09 DIAGNOSIS — R7989 Other specified abnormal findings of blood chemistry: Secondary | ICD-10-CM

## 2018-05-12 DIAGNOSIS — G4733 Obstructive sleep apnea (adult) (pediatric): Secondary | ICD-10-CM | POA: Diagnosis not present

## 2018-05-14 DIAGNOSIS — G4733 Obstructive sleep apnea (adult) (pediatric): Secondary | ICD-10-CM | POA: Diagnosis not present

## 2018-05-16 ENCOUNTER — Ambulatory Visit
Admission: RE | Admit: 2018-05-16 | Discharge: 2018-05-16 | Disposition: A | Discharge status: Home / Self Care | Payer: 59 | Source: Ambulatory Visit | Attending: Family Medicine | Admitting: Family Medicine

## 2018-05-16 DIAGNOSIS — K760 Fatty (change of) liver, not elsewhere classified: Secondary | ICD-10-CM | POA: Diagnosis not present

## 2018-05-16 DIAGNOSIS — R7989 Other specified abnormal findings of blood chemistry: Secondary | ICD-10-CM

## 2018-05-16 DIAGNOSIS — R945 Abnormal results of liver function studies: Principal | ICD-10-CM

## 2018-06-11 DIAGNOSIS — G4733 Obstructive sleep apnea (adult) (pediatric): Secondary | ICD-10-CM | POA: Diagnosis not present

## 2018-07-01 DIAGNOSIS — G4733 Obstructive sleep apnea (adult) (pediatric): Secondary | ICD-10-CM | POA: Diagnosis not present

## 2018-07-12 DIAGNOSIS — G4733 Obstructive sleep apnea (adult) (pediatric): Secondary | ICD-10-CM | POA: Diagnosis not present

## 2018-08-08 DIAGNOSIS — D128 Benign neoplasm of rectum: Secondary | ICD-10-CM | POA: Diagnosis not present

## 2018-08-08 DIAGNOSIS — Z86010 Personal history of colonic polyps: Secondary | ICD-10-CM | POA: Diagnosis not present

## 2018-08-08 DIAGNOSIS — D123 Benign neoplasm of transverse colon: Secondary | ICD-10-CM | POA: Diagnosis not present

## 2018-08-11 DIAGNOSIS — G4733 Obstructive sleep apnea (adult) (pediatric): Secondary | ICD-10-CM | POA: Diagnosis not present

## 2018-08-29 DIAGNOSIS — G4733 Obstructive sleep apnea (adult) (pediatric): Secondary | ICD-10-CM | POA: Diagnosis not present

## 2018-09-11 DIAGNOSIS — G4733 Obstructive sleep apnea (adult) (pediatric): Secondary | ICD-10-CM | POA: Diagnosis not present

## 2018-10-12 DIAGNOSIS — G4733 Obstructive sleep apnea (adult) (pediatric): Secondary | ICD-10-CM | POA: Diagnosis not present

## 2018-10-30 DIAGNOSIS — R7309 Other abnormal glucose: Secondary | ICD-10-CM | POA: Diagnosis not present

## 2018-10-30 DIAGNOSIS — R945 Abnormal results of liver function studies: Secondary | ICD-10-CM | POA: Diagnosis not present

## 2018-10-30 DIAGNOSIS — I10 Essential (primary) hypertension: Secondary | ICD-10-CM | POA: Diagnosis not present

## 2018-10-30 DIAGNOSIS — R7989 Other specified abnormal findings of blood chemistry: Secondary | ICD-10-CM | POA: Diagnosis not present

## 2018-10-30 DIAGNOSIS — K760 Fatty (change of) liver, not elsewhere classified: Secondary | ICD-10-CM | POA: Diagnosis not present

## 2018-11-10 DIAGNOSIS — G4733 Obstructive sleep apnea (adult) (pediatric): Secondary | ICD-10-CM | POA: Diagnosis not present

## 2018-11-24 ENCOUNTER — Ambulatory Visit: Payer: 59 | Admitting: Registered"

## 2018-11-26 ENCOUNTER — Encounter (INDEPENDENT_AMBULATORY_CARE_PROVIDER_SITE_OTHER): Payer: Self-pay

## 2018-12-01 ENCOUNTER — Ambulatory Visit (INDEPENDENT_AMBULATORY_CARE_PROVIDER_SITE_OTHER): Payer: Self-pay | Admitting: Family Medicine

## 2018-12-02 ENCOUNTER — Encounter: Payer: Self-pay | Admitting: Dietician

## 2018-12-02 ENCOUNTER — Other Ambulatory Visit: Payer: Self-pay

## 2018-12-02 ENCOUNTER — Encounter: Payer: 59 | Attending: Family Medicine | Admitting: Dietician

## 2018-12-02 DIAGNOSIS — E119 Type 2 diabetes mellitus without complications: Secondary | ICD-10-CM | POA: Diagnosis not present

## 2018-12-02 DIAGNOSIS — G4733 Obstructive sleep apnea (adult) (pediatric): Secondary | ICD-10-CM | POA: Diagnosis not present

## 2018-12-02 NOTE — Progress Notes (Signed)
Diabetes Self-Management Education  Visit Type: First/Initial  Appt. Start Time: 9:20am Appt. End Time: 10:00am  12/02/2018  Scott Wolfe, identified by name and date of birth, is a 58 y.o. male with a diagnosis of Diabetes: Type 2.   Today, Scott Wolfe originally scheduled provider was unavailable, so I incorporated his appointment between other patient appointments, causing the abbreviated visit time. Will account for this at follow up by allotting more time to cover more material.   ASSESSMENT  There were no vitals taken for this visit. (pt declined) There is no height or weight on file to calculate BMI.  Medical Hx: hypertension, OSA, obesity, heartburn/ reflux, high cholesterol  Allergies: none  Medications: glucosamine, rosuvastatin calcium, amlodipine besylate, lisinopril, indapamide, adderall Supplements: MVI    Psychosocial/ Lifestyle: Born in [place], wants to retire there in a few years. [place]. Works for 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 Has 3 grown daughters, 1 is expecting to get married this summer. Pt has been divorced for several years now. Switched to new PCP about 1 year ago. Pt is very motivated to manage his diabetes and focus on preventing further health complications.   Dietary Hx: Pt has visited [facility] in the past for weight management. Pt states he has since been eating better and focusing on his relationship with food, however believes he is eating too much food overall and is not exercising enough. Now, pt consumes more whole foods (less processed) and focuses on plant-based diet but would continue to learn how to consume less sodium for blood pressure control.    Diabetes Self-Management Education - 12/02/18 1200      Visit Information   Visit Type  First/Initial      Initial Visit   Diabetes Type  Type 2    Are you currently  following a meal plan?  No    Are you taking your medications as prescribed?  Not on Medications    Date Diagnosed  March 2020      Health Coping   How would you rate your overall health?  Poor      Psychosocial Assessment   Patient Belief/Attitude about Diabetes  Motivated to manage diabetes    Self-care barriers  None    Self-management support  Doctor's office    Other persons present  Patient    Patient Concerns  Nutrition/Meal planning;Monitoring;Healthy Lifestyle;Weight Control    Special Needs  None    Preferred Learning Style  No preference indicated    Learning Readiness  Change in progress    How often do you need to have someone help you when you read instructions, pamphlets, or other written materials from your doctor or pharmacy?  1 - Never    What is the last grade level you completed in school?  2 years college      Pre-Education Assessment   Patient understands the diabetes disease and treatment process.  Needs Review    Patient understands incorporating nutritional management into lifestyle.  Needs Instruction    Patient undertands incorporating physical activity into lifestyle.  Needs Instruction    Patient understands using medications safely.  Needs Instruction    Patient understands monitoring blood glucose, interpreting and using results  Needs Instruction    Patient understands prevention, detection, and treatment of acute complications.  Needs Instruction    Patient understands prevention, detection, and  treatment of chronic complications.  Needs Instruction    Patient understands how to develop strategies to address psychosocial issues.  Needs Instruction    Patient understands how to develop strategies to promote health/change behavior.  Needs Instruction      Complications   Last HgB A1C per patient/outside source  6.6 %    How often do you check your blood sugar?  0 times/day (not testing)    Have you had a dilated eye exam in the past 12 months?  Yes     Have you had a dental exam in the past 12 months?  No    Are you checking your feet?  No      Dietary Intake   Breakfast  1/2 cup oatmeal + raisins + blueberries + walnuts + cardamom + black pepper     Snack (morning)  almonds + pistachios     Lunch  frozen vegetables + fruit    (or vegetable hibachi if goes out to eat)   Snack (afternoon)  oranges + carrots     Dinner  stir fry chicken + vegetables + brown rice + sauce    Snack (evening)  usually none   treats self to a candy bar maybe ~1x/month   Beverage(s)  water      Exercise   Exercise Type  Light (walking / raking leaves)    How many days per week to you exercise?  2    How many minutes per day do you exercise?  30    Total minutes per week of exercise  60      Patient Education   Previous Diabetes Education  No    Disease state   Definition of diabetes, type 1 and 2, and the diagnosis of diabetes;Factors that contribute to the development of diabetes;Explored patient's options for treatment of their diabetes    Nutrition management   Role of diet in the treatment of diabetes and the relationship between the three main macronutrients and blood glucose level    Physical activity and exercise   Role of exercise on diabetes management, blood pressure control and cardiac health.    Monitoring  Taught/evaluated SMBG meter.   Provided One Touch Verio Flex (preferred by pt's insurance)   Acute complications  Taught treatment of hypoglycemia - the 15 rule.;Discussed and identified patients' treatment of hyperglycemia.      Individualized Goals (developed by patient)   Nutrition  General guidelines for healthy choices and portions discussed    Medications  Not Applicable    Monitoring   test my blood glucose as discussed    Reducing Risk  examine blood glucose patterns;treat hypoglycemia with 15 grams of carbs if blood glucose less than 70mg /dL      Post-Education Assessment   Patient understands the diabetes disease and treatment  process.  Demonstrates understanding / competency    Patient understands incorporating nutritional management into lifestyle.  Needs Instruction    Patient undertands incorporating physical activity into lifestyle.  Needs Review    Patient understands using medications safely.  Needs Instruction    Patient understands monitoring blood glucose, interpreting and using results  Needs Instruction    Patient understands prevention, detection, and treatment of acute complications.  Needs Instruction    Patient understands prevention, detection, and treatment of chronic complications.  Needs Instruction    Patient understands how to develop strategies to address psychosocial issues.  Needs Instruction    Patient understands how to develop strategies to promote  health/change behavior.  Needs Instruction      Outcomes   Expected Outcomes  Demonstrated interest in learning. Expect positive outcomes    Future DMSE  2 months    Program Status  Not Completed       Individualized Plan for Diabetes Self-Management Training:   Learning Objective:  Patient will have a greater understanding of diabetes self-management. Patient education plan is to attend individual and/or group sessions per assessed needs and concerns.   Plan:  Pt stated goals: Would like to continue to manage diabetes without medications and focus on incorporating more physical activity.   One Touch Verio Flex [id number] X Exp: 05/04/2019  Expected Outcomes:  Demonstrated interest in learning. Expect positive outcomes  Education material provided: ADA - How to Thrive: A Guide for Your Journey with Diabetes and No sodium seasonings  If problems or questions, patient to contact team via:  Phone and Email  Future DSME appointment: 2 months

## 2018-12-04 DIAGNOSIS — I10 Essential (primary) hypertension: Secondary | ICD-10-CM | POA: Diagnosis not present

## 2018-12-11 DIAGNOSIS — G4733 Obstructive sleep apnea (adult) (pediatric): Secondary | ICD-10-CM | POA: Diagnosis not present

## 2018-12-16 ENCOUNTER — Ambulatory Visit (INDEPENDENT_AMBULATORY_CARE_PROVIDER_SITE_OTHER): Payer: Self-pay | Admitting: Family Medicine

## 2019-01-05 DIAGNOSIS — G4733 Obstructive sleep apnea (adult) (pediatric): Secondary | ICD-10-CM | POA: Diagnosis not present

## 2019-01-10 DIAGNOSIS — G4733 Obstructive sleep apnea (adult) (pediatric): Secondary | ICD-10-CM | POA: Diagnosis not present

## 2019-01-21 ENCOUNTER — Ambulatory Visit: Payer: 59 | Admitting: Dietician

## 2019-02-26 ENCOUNTER — Encounter: Payer: 59 | Attending: Family Medicine | Admitting: Dietician

## 2019-02-26 ENCOUNTER — Encounter: Payer: Self-pay | Admitting: Dietician

## 2019-02-26 ENCOUNTER — Other Ambulatory Visit: Payer: Self-pay

## 2019-02-26 DIAGNOSIS — E119 Type 2 diabetes mellitus without complications: Secondary | ICD-10-CM | POA: Insufficient documentation

## 2019-02-26 NOTE — Patient Instructions (Addendum)
   Work towards forming a habit of incorporating consistent physical activity each week. Remember the ultimate goal is 150 minutes/week, ideally a combination of cardiovascular and strength training/weight bearing activities.   Walking trails and gardening both count and are great activities for exercise and stress relief!   Continue drinking plenty of fluids every day, especially water. Minimum of 64 fl oz/day.   Start eating more mindfully to help curb stress eating. Habits take time to form, so stick with it to help strengthen it over time!   Keep trigger foods out of the house, and stock up the fridge and pantry with good go-to snacks/foods (see handout for ideas.)   Serve yourself smaller portions. Try to wait 15 minutes after you finish your first serving to see if you are still hungry before getting more.   Take your time while eating by chewing each bite thoroughly, avoid distractions while eating, or eating with your non-dominant hand.   Have a list of non-food related activities on hand so that, as soon as you are done with a meal/snack, you can distract yourself with these things. (Ex: talking to someone on the phone, gardening, getting outside for a walk, chores, etc.)   Include protein with each meal, especially breakfast. Remember that protein helps you feel more satisfied, so having protein with meals earlier in the day may help prevent overeating in the evenings.   Similarly, fiber is a great way to help fill up and feel satisfied without overeating calories. As we talked about, fiber is a special type of carbohydrate that doesn't fully breakdown when we eat it, so it helps hold [place] over between meals and is a good way to help improve blood sugar and cholesterol. Good sources include vegetables, beans, and whole grains such as oats.   Find an accountability partner, such as your sister.   For follow up, consider talking to a counselor to help identify the root cause of your  stress and the best method to handle it. We also have dietitians in our office who specialize in disordered eating who may make a great fit!

## 2019-02-26 NOTE — Progress Notes (Signed)
Diabetes Self-Management Education  Visit Type: Follow-up  Appt. Start Time: 9:50am Appt. End Time: 11:00am   Telephone Visit This visit was completed via telephone due to the COVID-19 pandemic.   I spoke with Scott Wolfe and verified that I was speaking with the correct person with two patient identifiers (full name and date of birth).   I discussed the limitations related to this kind of visit and the patient is willing to proceed.   02/26/2019  Mr. Scott Wolfe, identified by name and date of birth, is a 58 y.o. male with a diagnosis of Diabetes: Type 2.   ASSESSMENT  Pt states he has been working from home ~50% of the time during the coronavirus pandemic. Since he is not spending as much time commuting to work, pt states he has spent more time outside gardening which has really helped with stress. Pt reports that stress/binge eating is a big problem for him, especially in the evenings and at night. Pt states he wakes up during the night and cannot go back to sleep unless he eats something. Typical intake (seen below) includes fruits and vegetables especially earlier in the day (and pt states he "does well" most of the day until he gets home in the evenings) but the evening meal usually involves fast food and overeating. Pt repeatedly talked about how stressed he is with work and the current circumstances, and that he finds himself overeating in the evenings to help cope with his stress. Pt lives alone and has found that not having someone there to keep him accountable makes it easier for him to overeat.    Diabetes Self-Management Education - 02/26/19 0953      Visit Information   Visit Type  Follow-up      Psychosocial Assessment   Self-care barriers  None    Self-management support  Family    Other persons present  Patient    Special Needs  None    Preferred Learning Style  No preference indicated    Learning Readiness  Ready    How often do you need to have someone help you  when you read instructions, pamphlets, or other written materials from your doctor or pharmacy?  1 - Never      Pre-Education Assessment   Patient understands the diabetes disease and treatment process.  Needs Review    Patient understands incorporating nutritional management into lifestyle.  Needs Review    Patient undertands incorporating physical activity into lifestyle.  Needs Review    Patient understands using medications safely.  Needs Review    Patient understands monitoring blood glucose, interpreting and using results  Needs Review    Patient understands prevention, detection, and treatment of acute complications.  Needs Review    Patient understands prevention, detection, and treatment of chronic complications.  Needs Review    Patient understands how to develop strategies to address psychosocial issues.  Needs Review    Patient understands how to develop strategies to promote health/change behavior.  Needs Review      Complications   Last HgB A1C per patient/outside source  6.6 %    How often do you check your blood sugar?  0 times/day (not testing)   1x/week   Fasting Blood glucose range (mg/dL)  70-129      Dietary Intake   Breakfast  smoothie (kale, water, 1-2 banana, cardamom, black pepper, blueberries)    Snack (morning)  oatmeal + banana + blueberry + mixed nuts    Lunch  smoothie (or frozen vegetables) (or whole wheat pasta)    Snack (afternoon)  1/2 watermelon    Dinner  Agricultural consultant (6 tacos)    Snack (evening)  burritos    Beverage(s)  water   32 oz refillable water bottle (2 to 3 / day)     Exercise   Exercise Type  ADL's;Light (walking / raking leaves)   walking trails, gardening     Patient Education   Previous Diabetes Education  Yes (please comment)   initial DSME visit   Nutrition management   Role of diet in the treatment of diabetes and the relationship between the three main macronutrients and blood glucose level;Reviewed blood glucose goals for pre and  post meals and how to evaluate the patients' food intake on their blood glucose level.;Meal options for control of blood glucose level and chronic complications.    Physical activity and exercise   Role of exercise on diabetes management, blood pressure control and cardiac health.;Helped patient identify appropriate exercises in relation to his/her diabetes, diabetes complications and other health issue.    Monitoring  Taught/evaluated SMBG meter.;Taught/discussed recording of test results and interpretation of SMBG.;Interpreting lab values - A1C, lipid, urine microalbumina.;Identified appropriate SMBG and/or A1C goals.;Purpose and frequency of SMBG.    Chronic complications  Relationship between chronic complications and blood glucose control;Identified and discussed with patient  current chronic complications    Psychosocial adjustment  Worked with patient to identify barriers to care and solutions;Role of stress on diabetes;Helped patient identify a support system for diabetes management;Identified and addressed patients feelings and concerns about diabetes;Brainstormed with patient on coping mechanisms for social situations, getting support from significant others, dealing with feelings about diabetes    Personal strategies to promote health  Helped patient develop diabetes management plan for (enter comment)   stress / binge eating     Individualized Goals (developed by patient)   Nutrition  Follow meal plan discussed;Other (comment)   Spread carbohydrate intake throughout the day; Include protein with meals and snacks, especially breakfast; Increase fiber intake   Physical Activity  Exercise 3-5 times per week;30 minutes per day   150 minutes/week of cardio and weight bearing activity   Monitoring   test blood glucose pre and post meals as discussed   fasting and after a meal on one week day and one weekend day per week   Problem Solving  Practice mindful eating to reduce stress/binge eating  episodes; keep trigger foods out of the house; have foods/snacks on hand that you feel good about "going to" when you do feel stressed; talk to your sister as accountability partner    Health Coping  ask for help with (comment)   accountability from sister     Post-Education Assessment   Patient understands the diabetes disease and treatment process.  Demonstrates understanding / competency    Patient understands incorporating nutritional management into lifestyle.  Demonstrates understanding / competency    Patient undertands incorporating physical activity into lifestyle.  Demonstrates understanding / competency    Patient understands using medications safely.  Demonstrates understanding / competency    Patient understands monitoring blood glucose, interpreting and using results  Demonstrates understanding / competency    Patient understands prevention, detection, and treatment of acute complications.  Demonstrates understanding / competency    Patient understands prevention, detection, and treatment of chronic complications.  Demonstrates understanding / competency    Patient understands how to develop strategies to address psychosocial issues.  Needs Review  Patient understands how to develop strategies to promote health/change behavior.  Needs Review      Outcomes   Expected Outcomes  Demonstrated interest in learning. Expect positive outcomes    Future DMSE  PRN    Program Status  Completed      Subsequent Visit   Since your last visit have you continued or begun to take your medications as prescribed?  Yes    Since your last visit have you had your blood pressure checked?  Yes   121 / 76 (pt reported)   Is your most recent blood pressure lower, unchanged, or higher since your last visit?  Lower    Since your last visit have you experienced any weight changes?  No change   309 lbs (pt reported)   Since your last visit, are you checking your blood glucose at least once a day?  No    1x/week      Individualized Plan for Diabetes Self-Management Training:   Learning Objective:  Patient will have a greater understanding of diabetes self-management. Patient education plan is to attend individual and/or group sessions per assessed needs and concerns.   Plan:  Patient Instructions    Work towards forming a habit of incorporating consistent physical activity each week. Remember the ultimate goal is 150 minutes/week, ideally a combination of cardiovascular and strength training/weight bearing activities.   Walking trails and gardening both count and are great activities for exercise and stress relief!   Continue drinking plenty of fluids every day, especially water. Minimum of 64 fl oz/day.   Start eating more mindfully to help curb stress eating. Habits take time to form, so stick with it to help strengthen it over time!   Keep trigger foods out of the house, and stock up the fridge and pantry with good go-to snacks/foods (see handout for ideas.)   Serve yourself smaller portions. Try to wait 15 minutes after you finish your first serving to see if you are still hungry before getting more.   Take your time while eating by chewing each bite thoroughly, avoid distractions while eating, or eating with your non-dominant hand.   Have a list of non-food related activities on hand so that, as soon as you are done with a meal/snack, you can distract yourself with these things. (Ex: talking to someone on the phone, gardening, getting outside for a walk, chores, etc.)   Include protein with each meal, especially breakfast. Remember that protein helps you feel more satisfied, so having protein with meals earlier in the day may help prevent overeating in the evenings.   Similarly, fiber is a great way to help fill up and feel satisfied without overeating calories. As we talked about, fiber is a special type of carbohydrate that doesn't fully breakdown when we eat it, so it helps  hold [place] over between meals and is a good way to help improve blood sugar and cholesterol. Good sources include vegetables, beans, and whole grains such as oats.   Find an accountability partner, such as your sister.   For follow up, consider talking to a counselor to help identify the root cause of your stress and the best method to handle it. We also have dietitians in our office who specialize in disordered eating who may make a great fit!    Expected Outcomes:  Demonstrated interest in learning. Expect positive outcomes  Education material provided:  Snack sheet   MyPlate Portions for Diabetes   NDPP Cope with Triggers  NCM Heart Healthy Consistent Carbohydrate Nutrition Therapy  Therapist-Counseling Resources (per pt request)         - provided to pt via MyChart secure message and email: [email or web address]    If problems or questions, patient to contact team via:  Phone and Email  Future DSME appointment: PRN

## 2019-06-08 ENCOUNTER — Ambulatory Visit (INDEPENDENT_AMBULATORY_CARE_PROVIDER_SITE_OTHER): Payer: Self-pay | Admitting: Bariatrics

## 2019-08-11 ENCOUNTER — Other Ambulatory Visit: Payer: Self-pay

## 2019-08-11 ENCOUNTER — Encounter: Payer: Self-pay | Admitting: Cardiovascular Disease

## 2019-08-11 ENCOUNTER — Ambulatory Visit: Payer: 59 | Admitting: Cardiovascular Disease

## 2019-08-11 VITALS — BP 146/82 | HR 94 | Ht 72.0 in | Wt 323.0 lb

## 2019-08-11 DIAGNOSIS — R0600 Dyspnea, unspecified: Secondary | ICD-10-CM | POA: Diagnosis not present

## 2019-08-11 DIAGNOSIS — R0609 Other forms of dyspnea: Secondary | ICD-10-CM

## 2019-08-11 DIAGNOSIS — R0602 Shortness of breath: Secondary | ICD-10-CM

## 2019-08-11 NOTE — Patient Instructions (Addendum)
Medication Instructions:  Your physician recommends that you continue on your current medications as directed. Please refer to the Current Medication list given to you today.  *If you need a refill on your cardiac medications before your next appointment, please call your pharmacy*  Lab Work: None Ordered  If you have labs (blood work) drawn today and your tests are completely normal, you will receive your results only by: Marland Kitchen MyChart Message (if you have MyChart) OR . A paper copy in the mail If you have any lab test that is abnormal or we need to change your treatment, we will call you to review the results.  Testing/Procedures: Your physician has requested that you have an echocardiogram. Echocardiography is a painless test that uses sound waves to create images of your heart. It provides your doctor with information about the size and shape of your heart and how well your heart's chambers and valves are working. This procedure takes approximately one hour. There are no restrictions for this procedure.  CT scanning, (CAT scanning), is a noninvasive, special x-ray that produces cross-sectional images of the body using x-rays and a computer. CT scans help physicians diagnose and treat medical conditions. Coronary CT scoring will help [place] make informed decisions about your medications and plan of care.   Follow-Up: At [facility], you and your health needs are our priority.  As part of our continuing mission to provide you with exceptional heart care, we have created designated Provider Care Teams.  These Care Teams include your primary Cardiologist (physician) and Advanced Practice Providers (APPs -  Physician Assistants and Nurse Practitioners) who all work together to provide you with the care you need, when you need it.  Your next appointment:   3 month(s) on March 8  The format for your next appointment:   In Person  Provider:   You may see Dr. Acie Fredrickson or one of the following  Advanced Practice Providers on your designated Care Team:    Richardson Dopp, PA-C  [place], [place]  Daune Perch, [place]

## 2019-08-11 NOTE — Progress Notes (Signed)
Cardiology Office Note:    Date:  08/11/2019   ID:  Scott Wolfe, DOB Feb 11, 1961, MRN [id number]  PCP:  London Pepper, MD  Cardiologist:  Nahser  Electrophysiologist:  None   Referring MD: London Pepper, MD   Chief Complaint  Patient presents with  . Shortness of Breath  . Hypertension    Dec. 8, 2020    Scott Wolfe is a 58 y.o. male with a hx of obesity, hypertension and shortness of breath.  We are asked to see him today by Dr. Orland Mustard for further evaluation of his shortness of breath.  Admits to not exercising and being out of shape. 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  Is fatigued all the time .  Works 12 hour days,   Sometimes 7 days a week.  Lots of stress.   Has 2 years left before retirement .    No cp,  No significant leg swelling .  Tries to eat correclty .   Tries to avoid salty and fatty foods.  No family hx of MI,  Mother died of an aortic aneurism.  BP is good at home.   BP is always elevated at the doctor's office.  Past Medical History:  Diagnosis Date  . ADHD (attention deficit hyperactivity disorder)   . Hypercholesterolemia   . Hypertension   . Renal disorder   . Sleep apnea    uses CPAP nightly    Past Surgical History:  Procedure Laterality Date  . HERNIA REPAIR    . KNEE ARTHROSCOPY WITH MEDIAL MENISECTOMY Left 09/10/2014   Procedure: LEFT KNEE ARTHROSCOPY WITH DEBRIDEMENT AND  MEDIAL MENISECTOMY;  Surgeon: Renette Butters, MD;  Location: [facility];  Service: Orthopedics;  Laterality: Left;    Current Medications: Current Meds  Medication Sig  . amLODipine (NORVASC) 2.5 MG tablet Take 2.5 mg by mouth daily.  Marland Kitchen aspirin 81 MG tablet Take 1 tablet (81 mg total) by mouth daily.  . cetirizine (ZYRTEC) 10 MG tablet Take 10 mg by mouth daily as needed for allergies.  . fluticasone (FLONASE) 50 MCG/ACT nasal spray Place 2 sprays into both nostrils daily as needed for allergies or rhinitis.  . Glucosamine-Chondroitin  (OSTEO BI-FLEX REGULAR STRENGTH PO) Take 1 tablet by mouth daily.  . indapamide (LOZOL) 1.25 MG tablet Take 1.25 mg by mouth daily.  Marland Kitchen lisdexamfetamine (VYVANSE) 60 MG capsule Take 60 mg by mouth every morning.  Marland Kitchen lisinopril (PRINIVIL,ZESTRIL) 10 MG tablet Take 10 mg by mouth daily.  . Multiple Vitamin (MULTIVITAMIN WITH MINERALS) TABS tablet Take 1 tablet by mouth daily.  . rosuvastatin (CRESTOR) 10 MG tablet Take 10 mg by mouth at bedtime.      Allergies:   Patient has no known allergies.   Social History   Socioeconomic History  . Marital status: Single    Spouse name: Not on file  . Number of children: Not on file  . Years of education: Not on file  . Highest education level: Not on file  Occupational History  . Not on file  Social Needs  . Financial resource strain: Not on file  . Food insecurity    Worry: Not on file    Inability: Not on file  . Transportation needs    Medical: Not on file    Non-medical: Not on file  Tobacco Use  . Smoking status: Never Smoker  . Smokeless tobacco: Never Used  Substance and Sexual Activity  . Alcohol use: Yes  Comment: social  . Drug use: No  . Sexual activity: Not on file  Lifestyle  . Physical activity    Days per week: Not on file    Minutes per session: Not on file  . Stress: Not on file  Relationships  . Social Herbalist on phone: Not on file    Gets together: Not on file    Attends religious service: Not on file    Active member of club or organization: Not on file    Attends meetings of clubs or organizations: Not on file    Relationship status: Not on file  Other Topics Concern  . Not on file  Social History Narrative  . Not on file     Family History: The patient's family history includes Aneurysm in his mother.  ROS:   Please see the history of present illness.     All other systems reviewed and are negative.  EKGs/Labs/Other Studies Reviewed:    The following studies were reviewed today:    EKG:   Dec. 8, 2020: Normal sinus rhythm at 94 beats a minute.  No acute changes.  Recent Labs: No results found for requested labs within last 8760 hours.  Recent Lipid Panel No results found for: CHOL, TRIG, HDL, CHOLHDL, VLDL, LDLCALC, LDLDIRECT  Physical Exam:    VS:  BP (!) 146/82   Pulse 94   Ht 6' (1.829 m)   Wt (!) 323 lb (146.5 kg)   SpO2 100%   BMI 43.81 kg/m     Wt Readings from Last 3 Encounters:  08/11/19 (!) 323 lb (146.5 kg)  01/29/18 (!) 302 lb (137 kg)  01/20/17 290 lb (131.5 kg)     GEN: Morbidly obese, middle-aged gentleman, no acute distress distress HEENT: Normal NECK: No JVD; No carotid bruits LYMPHATICS: No lymphadenopathy CARDIAC: RRR, no murmurs, rubs, gallops RESPIRATORY:  Clear to auscultation without rales, wheezing or rhonchi  ABDOMEN: Soft, non-tender, non-distended MUSCULOSKELETAL:  No edema; No deformity  SKIN: Warm and dry NEUROLOGIC:  Alert and oriented x 3 PSYCHIATRIC:  Normal affect   ASSESSMENT:    No diagnosis found. PLAN:    In order of problems listed above:  1. Shortness of breath with exertion: Patient has several reasons to have shortness of breath.  He is obese.  He admits to being out of shape.  He does not exercise regularly.  He is under lots of stress with his job and works 12-hour days sometimes 7 days a week.  We talked about the importance of getting regular exercise.  He needs to work on a diet, exercise, weight loss program.  We do need to get an echocardiogram to evaluate his cardiac status given his hypertension and diabetes.  We will also do a coronary calcium score to assess his risk for coronary artery disease.  I will see him back in the office in 3 to 4 months for follow-up visit.  2.  Hyperlipidemia: C ontinue current medications.  His LDL is 116.  2.  Hypertension: His blood pressures typically well controlled.  Continue current medications.   Medication Adjustments/Labs and Tests Ordered: Current  medicines are reviewed at length with the patient today.  Concerns regarding medicines are outlined above.  No orders of the defined types were placed in this encounter.  No orders of the defined types were placed in this encounter.   There are no Patient Instructions on file for this visit.   Signed, Mertie Moores, MD  08/11/2019 9:08 AM    [facility]

## 2019-08-24 ENCOUNTER — Ambulatory Visit (INDEPENDENT_AMBULATORY_CARE_PROVIDER_SITE_OTHER)
Admission: RE | Admit: 2019-08-24 | Discharge: 2019-08-24 | Disposition: A | Discharge status: Home / Self Care | Payer: 59 | Source: Ambulatory Visit | Attending: Cardiovascular Disease | Admitting: Cardiovascular Disease

## 2019-08-24 ENCOUNTER — Ambulatory Visit (HOSPITAL_COMMUNITY): Payer: 59 | Attending: Cardiovascular Disease

## 2019-08-24 ENCOUNTER — Other Ambulatory Visit: Payer: Self-pay

## 2019-08-24 DIAGNOSIS — R0600 Dyspnea, unspecified: Secondary | ICD-10-CM

## 2019-08-24 DIAGNOSIS — R0609 Other forms of dyspnea: Secondary | ICD-10-CM

## 2019-08-25 ENCOUNTER — Telehealth: Payer: Self-pay | Admitting: Nurse Practitioner

## 2019-08-25 DIAGNOSIS — E785 Hyperlipidemia, unspecified: Secondary | ICD-10-CM

## 2019-08-25 DIAGNOSIS — R748 Abnormal levels of other serum enzymes: Secondary | ICD-10-CM

## 2019-08-25 NOTE — Telephone Encounter (Signed)
LFTs remain elevated ~2x ULN on most recent labs scanned in Epic. Would see if pt is willing to come in for lipid appt with PharmD to discuss PCSK9i therapy.

## 2019-08-25 NOTE — Telephone Encounter (Signed)
-----   Message from Fay Records, MD sent at 08/24/2019 11:18 PM EST ----- Scott Wolfe is out for a few days. CT shows A>    Coronary artery calcifications consistent with some coronary artery disease  Not severe   Recomm:   1   Enteric coated ASA 81 mg    2.  Needs tighter control of LDL   Goal toward 70  Recomm increasing Crestor to 20 mg  F/U lpids and AST in 8 wk   Watch/cut back on saturated fats.  B.  Small nodule noted incidentally in lungs  Per radiology this should be followed in 6 months with noncontrast chest CT to reeval.  May represent an old, healed infection but need to confirm no change  WIll forward to [employer] as well

## 2019-08-25 NOTE — Telephone Encounter (Signed)
Reviewed results and plan of care with patient. He states he will resume his daily aspirin 81 mg. He states he is hesitant to increase his rosuvastatin due to past hx of liver disease. He would like to consult with his PCP. I advised that other agents that are not statins may be a better choice for him. He is agreeable to pursue PCSK9 therapy if indicated. I advised him to continue to work on heart healthy diet and exercise. I advised that I will forward message to Dr. Acie Fredrickson and PharmDs for advice and that I will also route message to Dr. Orland Mustard. Patient verbalized understanding and agreement and thanked me for the call.

## 2019-08-26 NOTE — Telephone Encounter (Signed)
I agree with plan to refer to lipid clinic to consider PCSK-9 inhibitor. Would not increase statin yet.

## 2019-08-26 NOTE — Telephone Encounter (Signed)
Spoke with patient who agrees to proceed with lipid clinic appointment to discuss PCSK9i therapy. Appointment scheduled and verified with patient who thanked me for the call.

## 2019-09-25 NOTE — Progress Notes (Unsigned)
Patient ID: Scott Wolfe                 DOB: 16-Jan-1961                    MRN: [id number]     HPI: Scott Wolfe is a 59 y.o. male patient referred to lipid clinic by Dr. Acie Fredrickson. PMH is significant for obesity, HTN, HLD, OSA on CPAP, and ADHD.  Patient was last seen by Dr. Acie Fredrickson on 08/11/19 fordyspnea on exertion. The patient admits to not exercising although he walks sometimes. The patient reports that he works 12 hour days, sometimes 7 days a week, and is under a lot of stress. He also states being fatigued all the time. The patient says he tries to eat healthy and avoid salty and fatty foods. Provider reports last LDL was 116 mg/dL (may be from a lipid panel in 2017- no record). Referred patient to lipid clinic for PCSK9 inhibitor and hold on increasing rosuvastatin.   Patient arrives to lipid clinic in good spirits.  Current Medications: rosuvastatin 10 mg once daily Intolerances: Lovaza 1G (unknown) Risk Factors: HTN LDL goal: < 100 mg/dL  Diet: Not exercising, walks sometimes  Exercise: he tries to eat healthy and avoid salty and fatty foods.  Family History: No family history of an MI. Mother dies of an aortic aneurism.  Social History: Alcohol??? No drug or tobacco use. 2 yrs to retirement  Labs: None  Past Medical History:  Diagnosis Date  . ADHD (attention deficit hyperactivity disorder)   . Hypercholesterolemia   . Hypertension   . Renal disorder   . Sleep apnea    uses CPAP nightly    Current Outpatient Medications on File Prior to Visit  Medication Sig Dispense Refill  . amLODipine (NORVASC) 2.5 MG tablet Take 2.5 mg by mouth daily.    Marland Kitchen aspirin 81 MG tablet Take 1 tablet (81 mg total) by mouth daily. 30 tablet 0  . cetirizine (ZYRTEC) 10 MG tablet Take 10 mg by mouth daily as needed for allergies.    . fluticasone (FLONASE) 50 MCG/ACT nasal spray Place 2 sprays into both nostrils daily as needed for allergies or rhinitis.    . Glucosamine-Chondroitin (OSTEO  BI-FLEX REGULAR STRENGTH PO) Take 1 tablet by mouth daily.    . indapamide (LOZOL) 1.25 MG tablet Take 1.25 mg by mouth daily.    Marland Kitchen lisdexamfetamine (VYVANSE) 60 MG capsule Take 60 mg by mouth every morning.    Marland Kitchen lisinopril (PRINIVIL,ZESTRIL) 10 MG tablet Take 10 mg by mouth daily.    . Multiple Vitamin (MULTIVITAMIN WITH MINERALS) TABS tablet Take 1 tablet by mouth daily.    . rosuvastatin (CRESTOR) 10 MG tablet Take 10 mg by mouth at bedtime.      No current facility-administered medications on file prior to visit.    No Known Allergies  Assessment/Plan:  1. Hyperlipidemia - LDL is above goal of <100 mg/dL.   Scott Wolfe, PharmD Candidate

## 2019-09-28 ENCOUNTER — Ambulatory Visit: Payer: 59

## 2019-10-09 ENCOUNTER — Encounter: Payer: Self-pay | Admitting: Pharmacist

## 2019-10-09 ENCOUNTER — Ambulatory Visit (INDEPENDENT_AMBULATORY_CARE_PROVIDER_SITE_OTHER): Payer: 59 | Admitting: Pharmacist

## 2019-10-09 ENCOUNTER — Other Ambulatory Visit: Payer: Self-pay

## 2019-10-09 DIAGNOSIS — E785 Hyperlipidemia, unspecified: Secondary | ICD-10-CM | POA: Diagnosis not present

## 2019-10-09 NOTE — Progress Notes (Signed)
Patient ID: Scott Wolfe                 DOB: 1961/07/22                    MRN: [id number]     HPI: Scott Wolfe is a 59 y.o. male patient referred to lipid clinic by Dr. Acie Fredrickson. PMH is significant for obesity, HTN, HLD, OSA on CPAP, CAD, DM and ADHD.  Patient was last seen by Dr. Acie Fredrickson on 08/11/2019. His job is very stressful and he works 12 hours days, sometimes 7 days a week. He is not currently exercising. He underwent coronary calcium scoring which showed a score 114, 72 percentile. He has a history of liver issues. AST on last labs was 79 (about 2x ULN). Currently on rosuvastatin 20mg  daily. The decision was made not to increase rosuvastatin and to refer to lipid clinic.  Patient presents to lipid clinic today. He is recovering from Leon about a month ago. He exercised for the first time yesterday. He started to see the healthy weight clinic at [facility]. Was interrupted by COVID but is planning to get back with them. He has a diagnosis of fatty liver. LFT actually a little better in December.   Current Medications: rosuvastatin 20 mg once daily Intolerances: Lovaza 1G (unknown) Risk Factors: HTN, CAD on CT (coronary calcium score 114, 72 percentile), DM (A1C 6.6) LDL goal: < 70 mg/dL  Diet: Not exercising, walks sometimes  Exercise: he tries to eat healthy and avoid salty and fatty foods.  Family History: No family history of an MI. Mother dies of an aortic aneurism.  Social History: No drug or tobacco use. 2 yrs to retirement  Labs: 08/19/2019 TC 161, TG 165, HDL 42, LDL 90 (rosuvastatin 20mg )  Past Medical History:  Diagnosis Date  . ADHD (attention deficit hyperactivity disorder)   . Hypercholesterolemia   . Hypertension   . Renal disorder   . Sleep apnea    uses CPAP nightly    Current Outpatient Medications on File Prior to Visit  Medication Sig Dispense Refill  . amLODipine (NORVASC) 2.5 MG tablet Take 2.5 mg by mouth daily.    Marland Kitchen aspirin 81 MG tablet Take 1 tablet (81 mg  total) by mouth daily. 30 tablet 0  . cetirizine (ZYRTEC) 10 MG tablet Take 10 mg by mouth daily as needed for allergies.    . fluticasone (FLONASE) 50 MCG/ACT nasal spray Place 2 sprays into both nostrils daily as needed for allergies or rhinitis.    . Glucosamine-Chondroitin (OSTEO BI-FLEX REGULAR STRENGTH PO) Take 1 tablet by mouth daily.    . indapamide (LOZOL) 1.25 MG tablet Take 1.25 mg by mouth daily.    Marland Kitchen lisdexamfetamine (VYVANSE) 60 MG capsule Take 60 mg by mouth every morning.    Marland Kitchen lisinopril (PRINIVIL,ZESTRIL) 10 MG tablet Take 10 mg by mouth daily.    . Multiple Vitamin (MULTIVITAMIN WITH MINERALS) TABS tablet Take 1 tablet by mouth daily.    . rosuvastatin (CRESTOR) 10 MG tablet Take 10 mg by mouth at bedtime.      No current facility-administered medications on file prior to visit.    No Known Allergies  Assessment/Plan:  1. Hyperlipidemia - LDL is above goal of <70 mg/dL. Discussed PCSK9i vs Zetia vs Nexletol with patient. Reviewed side effects, LDL reduction, potential for LFT elevation for all of them. Patient would like to proceed with PCSK9i. Injection technique reviewed with patient and teach back method used.  Will continue rosuvastatin 20mg  and I will submit a prior authorization for Repatha and call the patient once a determination is made. Will recheck lipids and LFT 1-2 months after starting.  Thank you,  Ramond Dial, Pharm.D, BCPS, CPP [place]  [id number], [place], [place] [address]  Phone: [phone]; Fax: [phone]

## 2019-10-09 NOTE — Patient Instructions (Signed)
It was a pleasure to meet you today!  Please continue rosuvastatin 20mg  daily.  I will submit a prior authorization for Repatha. I will let you know when I get it approved.  Keep working with the healthy weight center. Try to meal prep to avoid making unhealthy choices. Set a time each day that is just for you to exercise.  Please call us at [phone] with any questions or concerns.

## 2019-10-13 ENCOUNTER — Telehealth: Payer: Self-pay | Admitting: Pharmacist

## 2019-10-13 NOTE — Telephone Encounter (Signed)
Called patient to let him know that PA for Repatha was denied by insurance. Insurance wants to know why patient cant take Zetia. I have written an appeals letter justifying why Repatha is the better option over Zetia.  Will await response.

## 2019-10-15 NOTE — Telephone Encounter (Signed)
Had several issues faxing appeals letter. Called [employer] who gave me a fax # of [phone]. This number did not work for several tries as well. I do believe it finally sent this AM (10/15/19)

## 2019-10-23 IMAGING — US US ABDOMEN LIMITED
1 series · 14 of 25 positions shown · non-contrast
Comparison: CT scan of January 20, 2017.

CLINICAL DATA: Elevated liver function tests.

EXAM:
ULTRASOUND ABDOMEN LIMITED RIGHT UPPER QUADRANT

[Series 1: us abdomen limited · 0.25mm/px · 14 of 42 slices shown]
[im 1/42]
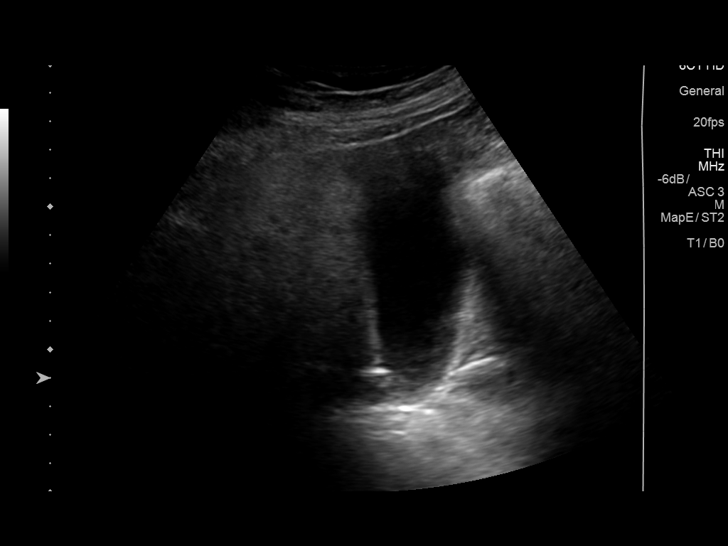
[im 4/42]
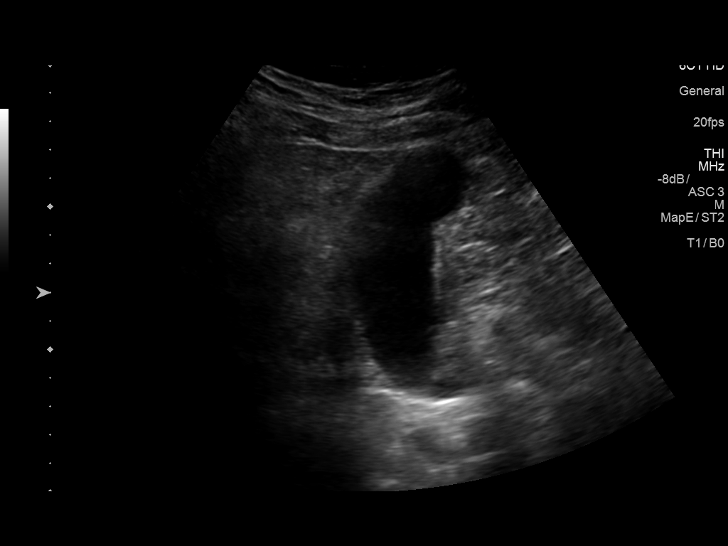
[im 7/42]
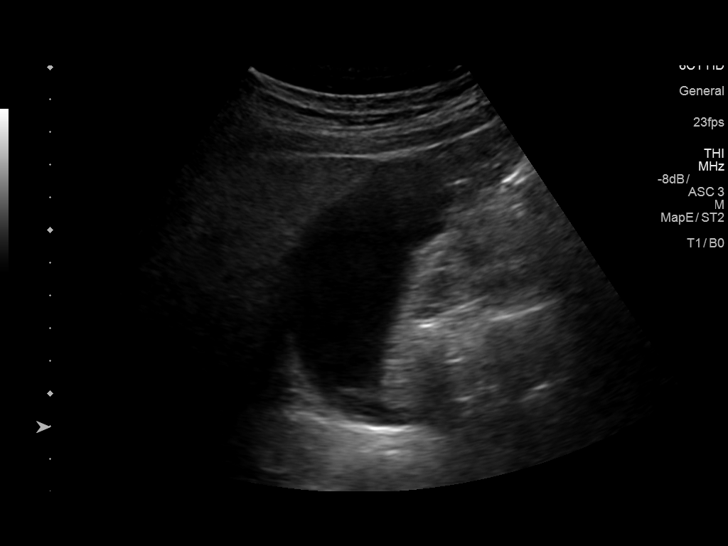
[im 11/42]
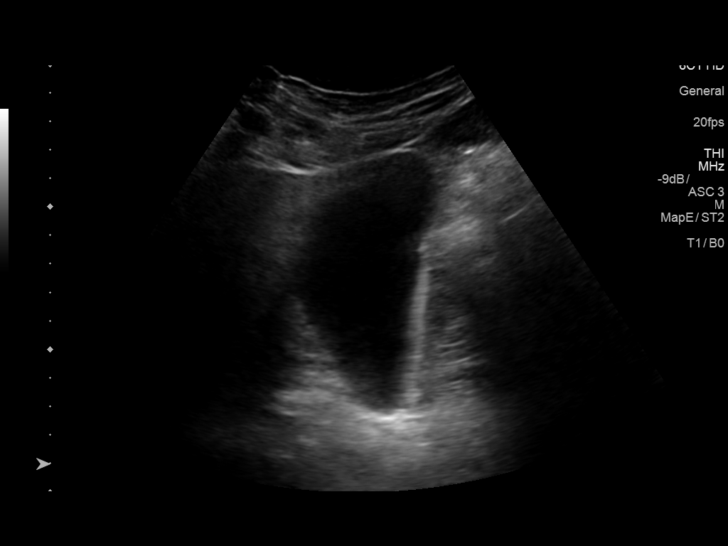
[im 14/42]
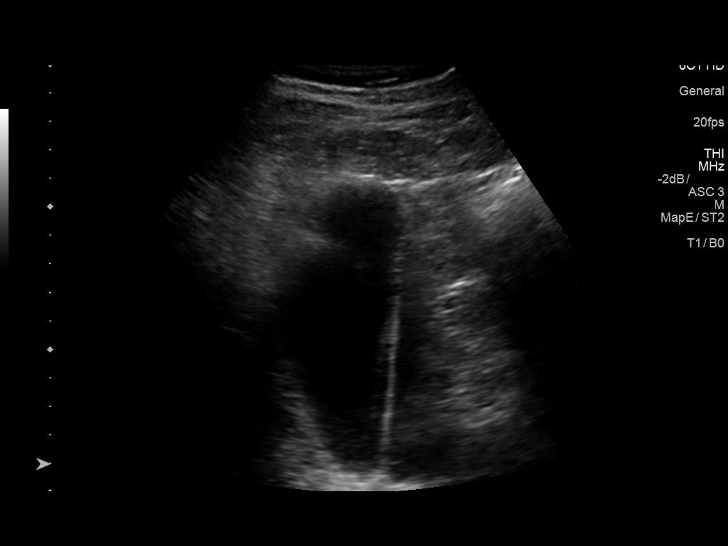
[im 16/42]
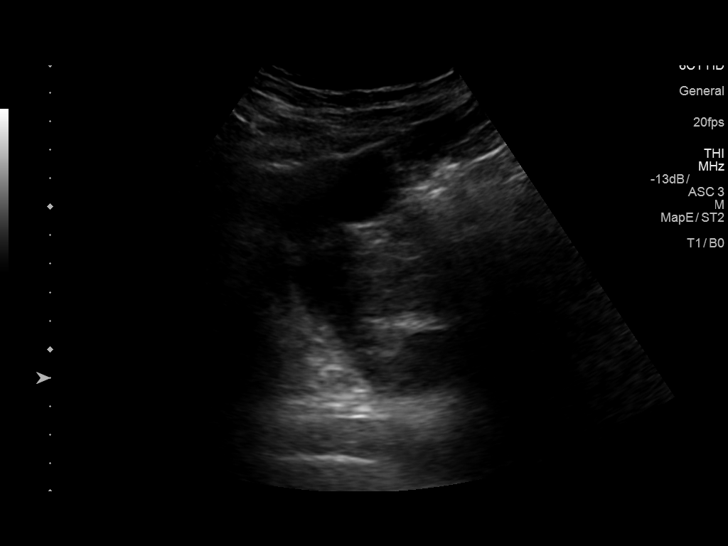
[im 19/42]
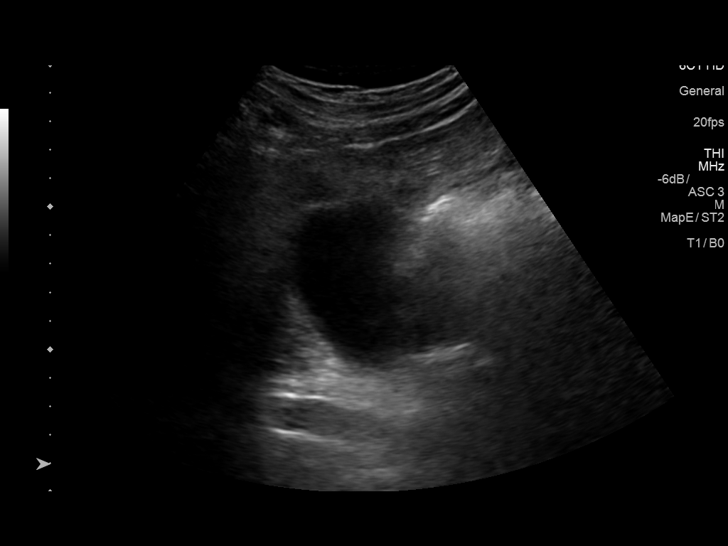
[im 23/42]
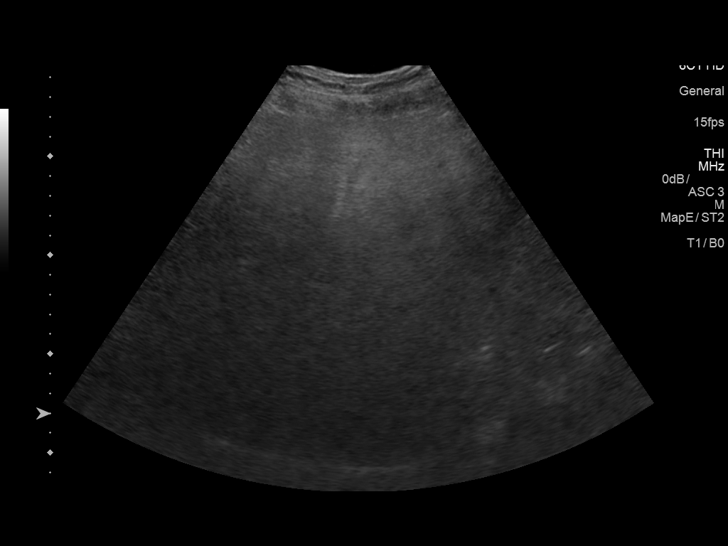
[im 26/42]
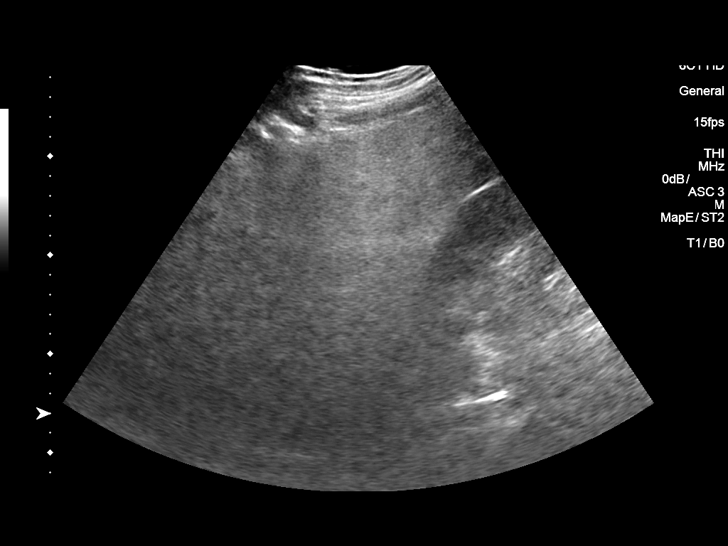
[im 28/42]
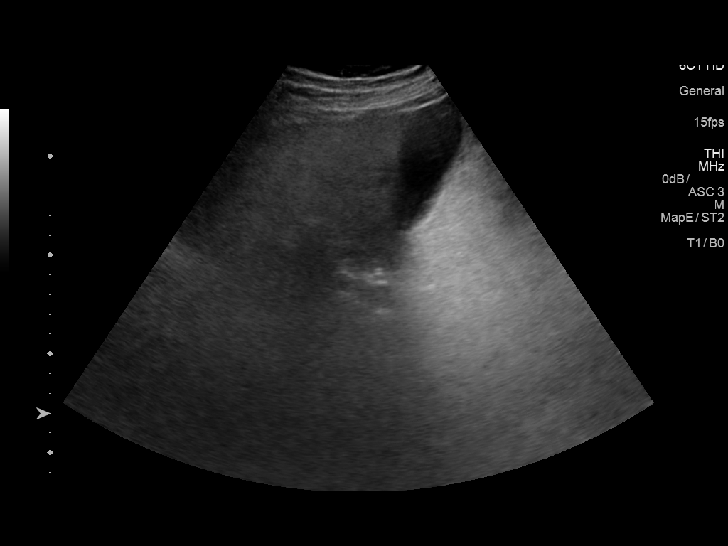
[im 31/42]
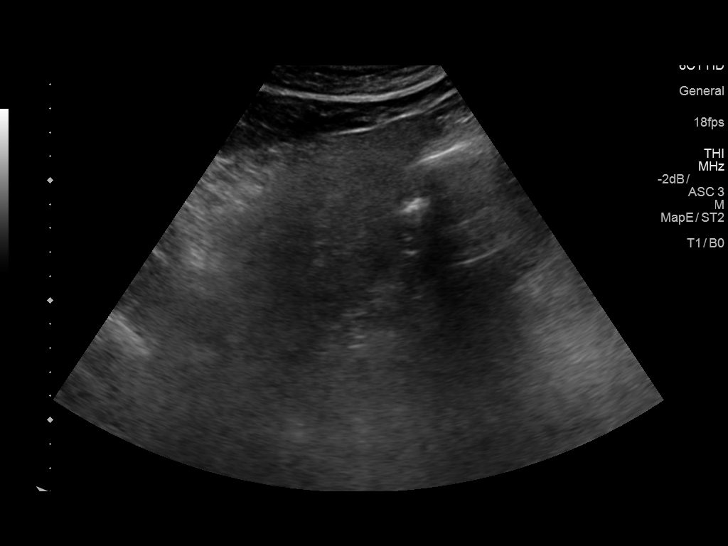
[im 35/42]
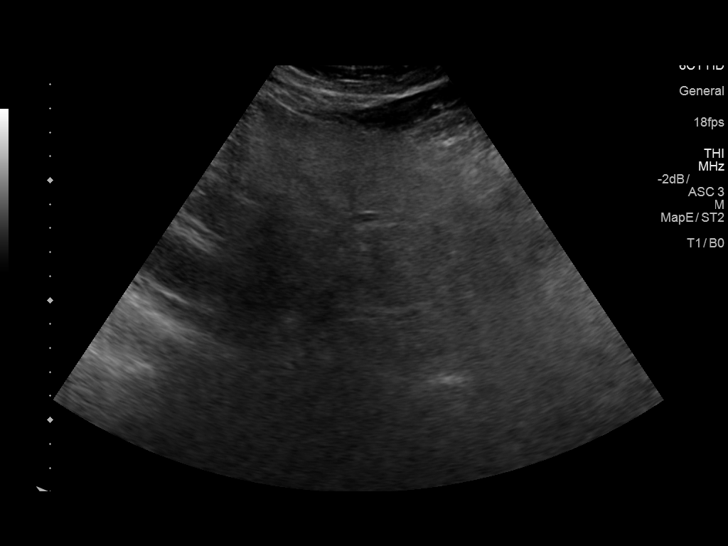
[im 38/42]
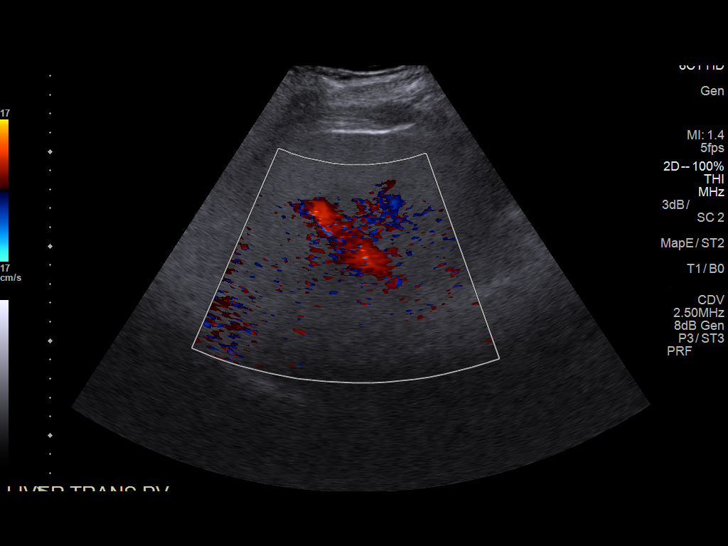
[im 42/42]
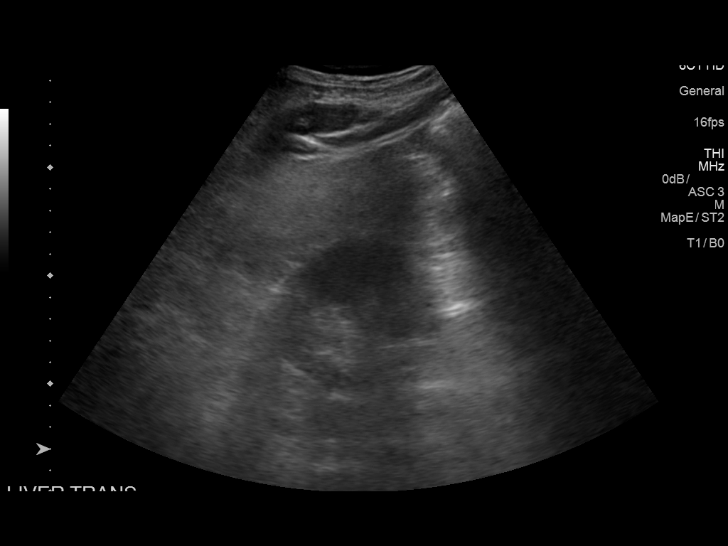

[14 of 25 positions shown; findings below may reference images not displayed]

FINDINGS: Gallbladder:

No gallstones or wall thickening visualized. No sonographic Murphy
sign noted by sonographer.

Common bile duct:

Diameter: 3.2 mm which is within normal limits.

Liver:

No focal lesion identified. Increased echogenicity of hepatic
parenchyma is noted suggesting fatty infiltration. Portal vein is
patent on color Doppler imaging with normal direction of blood flow
towards the liver.
IMPRESSION: Fatty infiltration of the liver. No other abnormality seen in the
right upper quadrant of the abdomen.

## 2019-11-03 ENCOUNTER — Ambulatory Visit (INDEPENDENT_AMBULATORY_CARE_PROVIDER_SITE_OTHER): Payer: 59 | Admitting: Family Medicine

## 2019-11-03 ENCOUNTER — Encounter (INDEPENDENT_AMBULATORY_CARE_PROVIDER_SITE_OTHER): Payer: Self-pay | Admitting: Family Medicine

## 2019-11-03 ENCOUNTER — Other Ambulatory Visit: Payer: Self-pay

## 2019-11-03 VITALS — BP 187/116 | HR 93 | Temp 98.4°F | Ht 71.0 in | Wt 326.0 lb

## 2019-11-03 DIAGNOSIS — R0602 Shortness of breath: Secondary | ICD-10-CM

## 2019-11-03 DIAGNOSIS — Z9189 Other specified personal risk factors, not elsewhere classified: Secondary | ICD-10-CM

## 2019-11-03 DIAGNOSIS — R7303 Prediabetes: Secondary | ICD-10-CM

## 2019-11-03 DIAGNOSIS — R7989 Other specified abnormal findings of blood chemistry: Secondary | ICD-10-CM

## 2019-11-03 DIAGNOSIS — I10 Essential (primary) hypertension: Secondary | ICD-10-CM

## 2019-11-03 DIAGNOSIS — Z6841 Body Mass Index (BMI) 40.0 and over, adult: Secondary | ICD-10-CM

## 2019-11-03 DIAGNOSIS — F908 Attention-deficit hyperactivity disorder, other type: Secondary | ICD-10-CM

## 2019-11-03 DIAGNOSIS — R5383 Other fatigue: Secondary | ICD-10-CM

## 2019-11-03 DIAGNOSIS — Z0289 Encounter for other administrative examinations: Secondary | ICD-10-CM

## 2019-11-03 DIAGNOSIS — G4733 Obstructive sleep apnea (adult) (pediatric): Secondary | ICD-10-CM | POA: Diagnosis not present

## 2019-11-03 DIAGNOSIS — F3289 Other specified depressive episodes: Secondary | ICD-10-CM | POA: Diagnosis not present

## 2019-11-03 DIAGNOSIS — R7401 Elevation of levels of liver transaminase levels: Secondary | ICD-10-CM

## 2019-11-03 DIAGNOSIS — E7849 Other hyperlipidemia: Secondary | ICD-10-CM

## 2019-11-03 NOTE — Progress Notes (Signed)
Dear Dr. Orland Mustard,   Thank you for referring Nijah Murfin to our clinic. The following note includes my evaluation and treatment recommendations.  Chief Complaint:   OBESITY Scott Wolfe (MR# [id number]) is a 59 y.o. male who presents for evaluation and treatment of obesity and related comorbidities. Current BMI is Body mass index is 45.47 kg/m. Lamoine has been struggling with his weight for many years and has been unsuccessful in either losing weight, maintaining weight loss, or reaching his healthy weight goal.  Paublo is currently in the action stage of change and ready to dedicate time achieving and maintaining a healthier weight. Treyvan is interested in becoming our patient and working on intensive lifestyle modifications including (but not limited to) diet and exercise for weight loss.  Geoff reports that he has an increasingly stressful job.  He skips meals.  He recently had COVID and still struggles with fatigue and has decreased sense of smell and taste.  He is hoping to retire in 2 years.  He says he wants to be healthy.  He is followed by his PCP at [facility], Psychiatry, and Cardiology.  He says he prefers an easy to follow plan without many options.  Majestic's habits were reviewed today and are as follows: his desired weight loss is 75 pounds, he has been heavy most of his life, he started gaining weight in 2017, his heaviest weight ever was 334 pounds, he craves chips, crackers, and nuts, he snacks frequently in the evenings, he wakes up occasionally in the middle of the night to eat, he skips breakfast if he feels full and skips lunch quite often, he frequently eats larger portions than normal and he struggles with emotional eating.  Depression Screen Demareon's Food and Mood (modified PHQ-9) score was 12.  Depression screen [facility] 2/9 11/03/2019  Decreased Interest 3  Down, Depressed, Hopeless 1  PHQ - 2 Score 4  Altered sleeping 3  Tired, decreased energy 1  Change in appetite 0  Feeling bad  or failure about yourself  1  Trouble concentrating 0  Moving slowly or fidgety/restless 3  Suicidal thoughts 0  PHQ-9 Score 12  Difficult doing work/chores Very difficult   Subjective:   1. Other fatigue Django denies daytime somnolence and admits to waking up still tired. Patent has a history of symptoms of OSA. Steen generally gets 6 hours of sleep per night, and states that he has poor sleep quality. Snoring is present. Apneic episodes are not present. Epworth Sleepiness Score is 2.  Patient uses a CPAP machine.  2. SOB (shortness of breath) on exertion Chevelle notes increasing shortness of breath with exercising and seems to be worsening over time with weight gain. He notes getting out of breath sooner with activity than he used to. This has gotten worse recently. Clark denies shortness of breath at rest or orthopnea.  3. OSA (obstructive sleep apnea) Dani has a diagnosis of sleep apnea. He reports that he is using a CPAP regularly.   4. Essential hypertension Review: taking medications as instructed, no medication side effects noted, no chest pain on exertion, no dyspnea on exertion, no swelling of ankles.  Quentez has white coat hypertension.  He is taking lisinopril and amlodipine for blood pressure control.  His home blood pressure was 137/85.  BP Readings from Last 3 Encounters:  11/03/19 (!) 187/116  08/11/19 (!) 146/82  01/20/17 (!) 169/109   5. Other hyperlipidemia Brianna has hyperlipidemia and has been trying to improve his cholesterol  levels with intensive lifestyle modification including a low saturated fat diet, exercise and weight loss. He denies any chest pain, claudication or myalgias.  He is taking Crestor.  6. Prediabetes Donta has a diagnosis of prediabetes based on his elevated HgA1c and was informed this puts him at greater risk of developing diabetes. He continues to work on diet and exercise to decrease his risk of diabetes. He denies nausea or hypoglycemia.  7.  Attention deficit hyperactivity disorder (ADHD), other type Mccauley sees Noemi Chapel, NP for his ADHD, and takes Vyvanse 60 mg daily.  8. Elevated LFTs Hewitt has elevated ALT. His BMI is over 40. He denies abdominal pain or jaundice. He denies excessive alcohol intake.  He had a liver ultrasound and was diagnosed with fatty liver disease.  9. Other depression, with emotional eating Amod is struggling with emotional eating and using food for comfort to the extent that it is negatively impacting his health. He has been working on behavior modification techniques to help reduce his emotional eating and has been unsuccessful. He shows no sign of suicidal or homicidal ideations.  If he skips breakfast and lunch, he binges at night.  Assessment/Plan:   1. Other fatigue Johneric does feel that his weight is causing his energy to be lower than it should be. Fatigue may be related to obesity, depression or many other causes. Labs will be ordered, and in the meanwhile, Garrit will focus on self care including making healthy food choices, increasing physical activity and focusing on stress reduction.  Orders - VITAMIN D 25 Hydroxy (Vit-D Deficiency, Fractures) - TSH - T4, free - T3 - Anemia panel  2. SOB (shortness of breath) on exertion Amen notes increasing shortness of breath with exercising and seems to be worsening over time with weight gain. He notes getting out of breath sooner with activity than he used to. This has gotten worse recently. Bow denies shortness of breath at rest or orthopnea.  Orders - CBC with Differential/Platelet  3. OSA (obstructive sleep apnea) Intensive lifestyle modifications are the first line treatment for this issue. We discussed several lifestyle modifications today and he will continue to work on diet, exercise and weight loss efforts. We will continue to monitor. Orders and follow up as documented in patient record.   Counseling  Sleep apnea is a condition in which  breathing pauses or becomes shallow during sleep. This happens over and over during the night. This disrupts your sleep and keeps your body from getting the rest that it needs, which can cause tiredness and lack of energy (fatigue) during the day.  Sleep apnea treatment: If you were given a device to open your airway while you sleep, USE IT!  Sleep hygiene:   Limit or avoid alcohol, caffeinated beverages, and cigarettes, especially close to bedtime.   Do not eat a large meal or eat spicy foods right before bedtime. This can lead to digestive discomfort that can make it hard for you to sleep.  Keep a sleep diary to help you and your health care provider figure out what could be causing your insomnia.  . Make your bedroom a dark, comfortable place where it is easy to fall asleep. ? Put up shades or blackout curtains to block light from outside. ? Use a white noise machine to block noise. ? Keep the temperature cool. . Limit screen use before bedtime. This includes: ? Watching TV. ? Using your smartphone, tablet, or computer. . Stick to a routine that includes going  to bed and waking up at the same times every day and night. This can help you fall asleep faster. Consider making a quiet activity, such as reading, part of your nighttime routine. . Try to avoid taking naps during the day so that you sleep better at night. . Get out of bed if you are still awake after 15 minutes of trying to sleep. Keep the lights down, but try reading or doing a quiet activity. When you feel sleepy, go back to bed.  4. Essential hypertension Javarie is working on healthy weight loss and exercise to improve blood pressure control. We will watch for signs of hypotension as he continues his lifestyle modifications.  5. Other hyperlipidemia Cardiovascular risk and specific lipid/LDL goals reviewed.  We discussed several lifestyle modifications today and Raylon will continue to work on diet, exercise and weight loss  efforts. Orders and follow up as documented in patient record.   Counseling Intensive lifestyle modifications are the first line treatment for this issue. . Dietary changes: Increase soluble fiber. Decrease simple carbohydrates. . Exercise changes: Moderate to vigorous-intensity aerobic activity 150 minutes per week if tolerated. . Lipid-lowering medications: see documented in medical record.  Orders - Lipid panel  6. Prediabetes Dennise will continue to work on weight loss, exercise, and decreasing simple carbohydrates to help decrease the risk of diabetes.  Orders  - Comprehensive metabolic panel - Hemoglobin A1c - Insulin, random  7. Attention deficit hyperactivity disorder (ADHD) Current treatment plan is effective, no change in therapy. We will continue to monitor. Orders and follow up as documented in patient record.  8. Elevated LFTs We discussed the likely diagnosis of non-alcoholic fatty liver disease today and how this condition is obesity related. Raekwon was educated the importance of weight loss. Brynn agreed to continue with his weight loss efforts with healthier diet and exercise as an essential part of his treatment plan.  9. Other depression, with emotional eating Behavior modification techniques were discussed today to help Kshawn deal with his emotional/non-hunger eating behaviors.  Orders and follow up as documented in patient record.   10. At risk for diabetes mellitus Itay was given approximately 15 minutes of diabetes education and counseling today. We discussed intensive lifestyle modifications today with an emphasis on weight loss as well as increasing exercise and decreasing simple carbohydrates in his diet. We also reviewed medication options with an emphasis on risk versus benefit of those discussed.   Repetitive spaced learning was employed today to elicit superior memory formation and behavioral change.  11. Class 3 severe obesity with serious comorbidity and  body mass index (BMI) of 45.0 to 49.9 in adult, unspecified obesity type (HCC) Abbott is currently in the action stage of change and his goal is to continue with weight loss efforts. I recommend Maclain begin the structured treatment plan as follows:  He has agreed to the Category 4 Plan.  Exercise goals: No exercise has been prescribed at this time.   Behavioral modification strategies: increasing lean protein intake, decreasing simple carbohydrates, increasing vegetables, increasing water intake, decreasing liquid calories and decreasing alcohol intake.  He was informed of the importance of frequent follow-up visits to maximize his success with intensive lifestyle modifications for his multiple health conditions. He was informed we would discuss his lab results at his next visit unless there is a critical issue that needs to be addressed sooner. Lonney agreed to keep his next visit at the agreed upon time to discuss these results.  Objective:  Blood pressure (!) 187/116, pulse 93, temperature 98.4 F (36.9 C), temperature source Oral, height 5\' 11"  (1.803 m), weight (!) 326 lb (147.9 kg), SpO2 99 %. Body mass index is 45.47 kg/m.  Indirect Calorimeter completed today shows a VO2 of 387 and a REE of 2694.  His calculated basal metabolic rate is [id number] thus his basal metabolic rate is worse than expected.  General: Cooperative, alert, well developed, in no acute distress. HEENT: Conjunctivae and lids unremarkable. Cardiovascular: Regular rhythm.  Lungs: Normal work of breathing. Neurologic: No focal deficits.   Lab Results  Component Value Date   CREATININE 1.19 01/20/2017   BUN 11 01/20/2017   NA 141 01/20/2017   K 4.8 01/20/2017   CL 107 01/20/2017   CO2 27 01/20/2017   Lab Results  Component Value Date   WBC 14.7 (H) 01/20/2017   HGB 17.4 (H) 01/20/2017   HCT 47.9 01/20/2017   MCV 94.5 01/20/2017   PLT 109 (L) 01/20/2017   Attestation Statements:   This is the patient's first  visit at [facility]. The patient's NEW PATIENT PACKET was reviewed at length. Included in the packet: current and past health history, medications, allergies, ROS, gynecologic history (women only), surgical history, family history, social history, weight history, weight loss surgery history (for those that have had weight loss surgery), nutritional evaluation, mood and food questionnaire, PHQ9, Epworth questionnaire, sleep habits questionnaire, patient life and health improvement goals questionnaire. These will all be scanned into the patient's chart under media.   During the visit, I independently reviewed the patient's EKG, bioimpedance scale results, and indirect calorimeter results. I used this information to tailor a meal plan for the patient that will help him to lose weight and will improve his obesity-related conditions going forward. I performed a medically necessary appropriate examination and/or evaluation. I discussed the assessment and treatment plan with the patient. The patient was provided an opportunity to ask questions and all were answered. The patient agreed with the plan and demonstrated an understanding of the instructions. Labs were ordered at this visit and will be reviewed at the next visit unless more critical results need to be addressed immediately. Clinical information was updated and documented in the EMR.   I, Water quality scientist, CMA, am acting as Location manager for [employer], DO.  I have reviewed the above documentation for accuracy and completeness, and I agree with the above. Briscoe Deutscher, DO

## 2019-11-04 LAB — ANEMIA PANEL
Ferritin: 269 ng/mL (ref 30–400)
Folate, Hemolysate: 402 ng/mL
Folate, RBC: 822 ng/mL (ref >498)
Hematocrit: 48.9 % (ref 37.5–51.0)
Iron Saturation: 30 % (ref 15–55)
Iron: 124 ug/dL (ref 38–169)
Retic Ct Pct: 2.4 % (ref 0.6–2.6)
Total Iron Binding Capacity: 419 ug/dL (ref 250–450)
UIBC: 295 ug/dL (ref 111–343)
Vitamin B-12: 958 pg/mL (ref 232–1245)

## 2019-11-04 LAB — LIPID PANEL
Chol/HDL Ratio: 3.1 ratio (ref 0.0–5.0)
Cholesterol, Total: 159 mg/dL (ref 100–199)
HDL: 51 mg/dL (ref >39)
LDL Chol Calc (NIH): 83 mg/dL (ref 0–99)
Triglycerides: 144 mg/dL (ref 0–149)
VLDL Cholesterol Cal: 25 mg/dL (ref 5–40)

## 2019-11-04 LAB — CBC WITH DIFFERENTIAL/PLATELET
Basophils Absolute: 0.1 10*3/uL (ref 0.0–0.2)
Basos: 1 %
EOS (ABSOLUTE): 0.2 10*3/uL (ref 0.0–0.4)
Eos: 3 %
Hemoglobin: 17.4 g/dL (ref 13.0–17.7)
Immature Grans (Abs): 0 10*3/uL (ref 0.0–0.1)
Immature Granulocytes: 0 %
Lymphocytes Absolute: 1.7 10*3/uL (ref 0.7–3.1)
Lymphs: 26 %
MCH: 34.7 pg — ABNORMAL HIGH (ref 26.6–33.0)
MCHC: 35.6 g/dL (ref 31.5–35.7)
MCV: 97 fL (ref 79–97)
Monocytes Absolute: 0.6 10*3/uL (ref 0.1–0.9)
Monocytes: 9 %
Neutrophils Absolute: 4 10*3/uL (ref 1.4–7.0)
Neutrophils: 61 %
Platelets: 134 10*3/uL — ABNORMAL LOW (ref 150–450)
RBC: 5.02 x10E6/uL (ref 4.14–5.80)
RDW: 13 % (ref 11.6–15.4)
WBC: 6.7 10*3/uL (ref 3.4–10.8)

## 2019-11-04 LAB — COMPREHENSIVE METABOLIC PANEL
ALT: 113 IU/L — ABNORMAL HIGH (ref 0–44)
AST: 64 IU/L — ABNORMAL HIGH (ref 0–40)
Albumin/Globulin Ratio: 2.4 — ABNORMAL HIGH (ref 1.2–2.2)
Albumin: 4.8 g/dL (ref 3.8–4.9)
Alkaline Phosphatase: 67 IU/L (ref 39–117)
BUN/Creatinine Ratio: 15 (ref 9–20)
BUN: 14 mg/dL (ref 6–24)
Bilirubin Total: 0.9 mg/dL (ref 0.0–1.2)
CO2: 24 mmol/L (ref 20–29)
Calcium: 9.9 mg/dL (ref 8.7–10.2)
Chloride: 102 mmol/L (ref 96–106)
Creatinine, Ser: 0.91 mg/dL (ref 0.76–1.27)
GFR calc Af Amer: 106 mL/min/{1.73_m2} (ref >59)
GFR calc non Af Amer: 92 mL/min/{1.73_m2} (ref >59)
Globulin, Total: 2 g/dL (ref 1.5–4.5)
Glucose: 130 mg/dL — ABNORMAL HIGH (ref 65–99)
Potassium: 4.5 mmol/L (ref 3.5–5.2)
Sodium: 141 mmol/L (ref 134–144)
Total Protein: 6.8 g/dL (ref 6.0–8.5)

## 2019-11-04 LAB — TSH: TSH: 1.91 u[IU]/mL (ref 0.450–4.500)

## 2019-11-04 LAB — T4, FREE: Free T4: 1.17 ng/dL (ref 0.82–1.77)

## 2019-11-04 LAB — HEMOGLOBIN A1C
Est. average glucose Bld gHb Est-mCnc: 143 mg/dL
Hgb A1c MFr Bld: 6.6 % — ABNORMAL HIGH (ref 4.8–5.6)

## 2019-11-04 LAB — INSULIN, RANDOM: INSULIN: 125 u[IU]/mL — ABNORMAL HIGH (ref 2.6–24.9)

## 2019-11-04 LAB — T3: T3, Total: 131 ng/dL (ref 71–180)

## 2019-11-04 LAB — VITAMIN D 25 HYDROXY (VIT D DEFICIENCY, FRACTURES): Vit D, 25-Hydroxy: 26.4 ng/mL — ABNORMAL LOW (ref 30.0–100.0)

## 2019-11-05 ENCOUNTER — Telehealth: Payer: Self-pay | Admitting: Pharmacist

## 2019-11-05 MED ORDER — REPATHA SURECLICK 140 MG/ML ~~LOC~~ SOAJ: 1.0000 "pen " | SUBCUTANEOUS | 11 refills | Status: DC

## 2019-11-05 NOTE — Telephone Encounter (Addendum)
Repatha appeals overturned. Now approved through 04/25/20. Patient made aware. Rx and copay card sent to pharmacy. Will call patient in about a month and a half to set up lipid/lft. Will try to coordinate with labs needed for weight loss program

## 2019-11-09 ENCOUNTER — Encounter: Payer: Self-pay | Admitting: Cardiovascular Disease

## 2019-11-09 ENCOUNTER — Ambulatory Visit: Payer: 59 | Admitting: Cardiovascular Disease

## 2019-11-09 ENCOUNTER — Other Ambulatory Visit: Payer: Self-pay

## 2019-11-09 VITALS — BP 138/95 | HR 84 | Ht 72.0 in | Wt 324.5 lb

## 2019-11-09 DIAGNOSIS — I10 Essential (primary) hypertension: Secondary | ICD-10-CM | POA: Diagnosis not present

## 2019-11-09 DIAGNOSIS — E785 Hyperlipidemia, unspecified: Secondary | ICD-10-CM

## 2019-11-09 NOTE — Progress Notes (Signed)
Cardiology Office Note:    Date:  11/09/2019   ID:  Scott Wolfe, DOB 12-25-1960, MRN [id number]  PCP:  London Pepper, MD  Cardiologist:  Lemon Whitacre  Electrophysiologist:  None   Referring MD: London Pepper, MD   Chief Complaint  Patient presents with  . Hypertension  . Obesity  . Hyperlipidemia    Dec. 8, 2020    Scott Wolfe is a 59 y.o. male with a hx of obesity, hypertension and shortness of breath.  We are asked to see him today by Dr. Orland Mustard for further evaluation of his shortness of breath.  Admits to not exercising and being out of shape. 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  Is fatigued all the time .  Works 12 hour days,   Sometimes 7 days a week.  Lots of stress.   Has 2 years left before retirement .    No cp,  No significant leg swelling .  Tries to eat correclty .   Tries to avoid salty and fatty foods.  No family hx of MI,  Mother died of an aortic aneurism.  BP is good at home.   BP is always elevated at the doctor's office.  November 09, 2019: Scott Wolfe seen today for follow-up of his obesity, hypertension, hyperlipidemia. He continues to have some shortness of breath. Getting over [place] in early January .   Still having some shortness of breath and cough.  Still having lots of fatigue .   Still cannot taste or smell.  His pulse oxymetry showed O2 sats of 86% at times.   Improved if he went to bed and used his CPAP .  Is with the [employer] program .   2nd week of that BP at home is better.   Still eating some salty foods.   Wt is 324 lbs today .  Past Medical History:  Diagnosis Date  . ADHD (attention deficit hyperactivity disorder)   . Borderline diabetes   . COVID-19 virus infection    January 2021, contd sxs, Taste/smell, fogginess,headache, nausea,tremor,   . Dyspnea   . Elevated liver enzymes   . Hypercholesterolemia   . Hypertension   . Kidney stone   . Lower extremity edema   . Obesity   . OCD (obsessive compulsive  disorder)   . Prediabetes   . Renal disorder   . Sleep apnea    uses CPAP nightly    Past Surgical History:  Procedure Laterality Date  . HERNIA REPAIR    . KNEE ARTHROSCOPY WITH MEDIAL MENISECTOMY Left 09/10/2014   Procedure: LEFT KNEE ARTHROSCOPY WITH DEBRIDEMENT AND  MEDIAL MENISECTOMY;  Surgeon: Renette Butters, MD;  Location: [facility];  Service: Orthopedics;  Laterality: Left;    Current Medications: Current Meds  Medication Sig  . amLODipine (NORVASC) 2.5 MG tablet Take 2.5 mg by mouth daily.  Marland Kitchen aspirin 81 MG tablet Take 1 tablet (81 mg total) by mouth daily.  . cetirizine (ZYRTEC) 10 MG tablet Take 10 mg by mouth daily as needed for allergies.  . Evolocumab (REPATHA SURECLICK) XX123456 MG/ML SOAJ Inject 1 pen into the skin every 14 (fourteen) days.  . Glucosamine-Chondroitin (OSTEO BI-FLEX REGULAR STRENGTH PO) Take 1 tablet by mouth daily.  . indapamide (LOZOL) 1.25 MG tablet Take 1.25 mg by mouth daily.  Marland Kitchen lisdexamfetamine (VYVANSE) 60 MG capsule Take 60 mg by mouth every morning.  Marland Kitchen lisinopril (ZESTRIL) 20 MG tablet Take 20 mg by mouth daily.   Marland Kitchen  Multiple Vitamin (MULTIVITAMIN WITH MINERALS) TABS tablet Take 1 tablet by mouth daily.  . rosuvastatin (CRESTOR) 20 MG tablet Take 20 mg by mouth at bedtime.      Allergies:   Patient has no known allergies.   Social History   Socioeconomic History  . Marital status: Single    Spouse name: Not on file  . Number of children: Not on file  . Years of education: Not on file  . Highest education level: Not on file  Occupational History  . Occupation: Architect   Tobacco Use  . Smoking status: Never Smoker  . Smokeless tobacco: Never Used  Substance and Sexual Activity  . Alcohol use: Yes    Comment: social  . Drug use: No  . Sexual activity: Not on file  Other Topics Concern  . Not on file  Social History Narrative  . Not on file   Social Determinants of Health   Financial Resource Strain:     . Difficulty of Paying Living Expenses: Not on file  Food Insecurity:   . Worried About Charity fundraiser in the Last Year: Not on file  . Ran Out of Food in the Last Year: Not on file  Transportation Needs:   . Lack of Transportation (Medical): Not on file  . Lack of Transportation (Non-Medical): Not on file  Physical Activity:   . Days of Exercise per Week: Not on file  . Minutes of Exercise per Session: Not on file  Stress:   . Feeling of Stress : Not on file  Social Connections:   . Frequency of Communication with Friends and Family: Not on file  . Frequency of Social Gatherings with Friends and Family: Not on file  . Attends Religious Services: Not on file  . Active Member of Clubs or Organizations: Not on file  . Attends Archivist Meetings: Not on file  . Marital Status: Not on file     Family History: The patient's family history includes Aneurysm in his mother; Sudden death in his mother.  ROS:   Please see the history of present illness.     All other systems reviewed and are negative.  EKGs/Labs/Other Studies Reviewed:    The following studies were reviewed today:   EKG:      Recent Labs: 11/03/2019: ALT 113; BUN 14; Creatinine, Ser 0.91; Hemoglobin 17.4; Platelets 134; Potassium 4.5; Sodium 141; TSH 1.910  Recent Lipid Panel    Component Value Date/Time   CHOL 159 11/03/2019 1534   TRIG 144 11/03/2019 1534   HDL 51 11/03/2019 1534   CHOLHDL 3.1 11/03/2019 1534   LDLCALC 83 11/03/2019 1534    Physical Exam:    Physical Exam: Blood pressure (!) 138/95, pulse 84, height 6' (1.829 m), weight (!) 324 lb 8 oz (147.2 kg), SpO2 98 %.  GEN:   Morbidly obese male,   HEENT: Normal NECK: No JVD; No carotid bruits LYMPHATICS: No lymphadenopathy CARDIAC: RRR   RESPIRATORY:  Clear to auscultation without rales, wheezing or rhonchi  ABDOMEN: Soft, non-tender, non-distended MUSCULOSKELETAL:  No edema; No deformity  SKIN: Warm and dry NEUROLOGIC:   Alert and oriented x 3  ASSESSMENT:    1. Obesity, Class III, BMI 40-49.9 (morbid obesity) ([place])   2. Hyperlipidemia, unspecified hyperlipidemia type   3. Essential hypertension    PLAN:      Shortness of breath with exertion:   Echo shows normal LV function.  Suspect his shortness of breath is  weight related.  In addition, he is still short of breath from his recent Covid infection in January.  2.  Hyperlipidemia:   His liver enzymes were increased when he was on a statin.  He is now on Repatha and seems to be doing well.  He is followed in our lipid clinic. Coronary calcium score is 114 ( 72 % for age / gender matched controlls) No angina.   Cont aggressive approach to his lipids and risk factor management .  We will have him see an APP in 6 months with fasting lipids, liver enzymes, basic metabolic profile.  3.  Hypertension: He still eats some salty foods.  His blood pressure is in the normal range at home.  It is a little elevated here.  I offered to start him on chlorthalidone and potassium but he states that his blood pressure is fairly normal at home.  He will continue to work hard at avoiding salty foods and continue with his weight loss program.  4.  Obesity: He is on the [place] healthy weight loss management program.  He is pleased with his progress and is changed his diet.  5.  Recent Covid infection.  He had Covid in January.  He still has some shortness of breath and fatigue.  He still has lost his sense of taste and smell.  6.  Lung nodule: He was found to have a small lung nodule at his CT scan in December.  He will need to have a repeat CT scan later this year.  I encouraged him to follow-up with his primary medical doctor to follow this lung nodule.  If he does not get the CT scan for some reason, he is to call us and we will reorder the scan but I encouraged him that it would be best to have this followed through his primary or through pulmonary.  I will have him  see an APP for follow-up in 6 months.  Medication Adjustments/Labs and Tests Ordered: Current medicines are reviewed at length with the patient today.  Concerns regarding medicines are outlined above.  Orders Placed This Encounter  Procedures  . Lipid Profile  . Basic Metabolic Panel (BMET)  . Hepatic function panel   No orders of the defined types were placed in this encounter.   Patient Instructions  Medication Instructions:  Your physician recommends that you continue on your current medications as directed. Please refer to the Current Medication list given to you today.  *If you need a refill on your cardiac medications before your next appointment, please call your pharmacy*   Lab Work: Your physician recommends that you return for lab work in: 6 months on the day of or a few days before your office visit  You will need to FAST for this appointment - nothing to eat or drink after midnight the night before except water.   If you have labs (blood work) drawn today and your tests are completely normal, you will receive your results only by: Marland Kitchen MyChart Message (if you have MyChart) OR . A paper copy in the mail If you have any lab test that is abnormal or we need to change your treatment, we will call you to review the results.   Testing/Procedures: None Ordered    Follow-Up: At [facility], you and your health needs are our priority.  As part of our continuing mission to provide you with exceptional heart care, we have created designated Provider Care Teams.  These Care Teams  include your primary Cardiologist (physician) and Advanced Practice Providers (APPs -  Physician Assistants and Nurse Practitioners) who all work together to provide you with the care you need, when you need it.   Your next appointment:   6 month(s)  The format for your next appointment:   In Person  Provider:   Richardson Dopp, PA-C or Robbie Lis, PA-C       Signed, Mertie Moores, MD    11/09/2019 8:37 AM    [place]

## 2019-11-09 NOTE — Patient Instructions (Addendum)
Medication Instructions:  Your physician recommends that you continue on your current medications as directed. Please refer to the Current Medication list given to you today.  *If you need a refill on your cardiac medications before your next appointment, please call your pharmacy*   Lab Work: Your physician recommends that you return for lab work in: 6 months on the day of or a few days before your office visit You will need to FAST for this appointment - nothing to eat or drink after midnight the night before except water.  If you have labs (blood work) drawn today and your tests are completely normal, you will receive your results only by: . MyChart Message (if you have MyChart) OR . A paper copy in the mail If you have any lab test that is abnormal or we need to change your treatment, we will call you to review the results.   Testing/Procedures: None Ordered   Follow-Up: At [facility], you and your health needs are our priority.  As part of our continuing mission to provide you with exceptional heart care, we have created designated Provider Care Teams.  These Care Teams include your primary Cardiologist (physician) and Advanced Practice Providers (APPs -  Physician Assistants and Nurse Practitioners) who all work together to provide you with the care you need, when you need it.     Your next appointment:   6 month(s)  The format for your next appointment:   In Person  Provider:   Scott Weaver, PA-C or Vin Bhagat, PA-C    

## 2019-11-17 ENCOUNTER — Encounter (INDEPENDENT_AMBULATORY_CARE_PROVIDER_SITE_OTHER): Payer: Self-pay | Admitting: Family Medicine

## 2019-11-17 ENCOUNTER — Ambulatory Visit (INDEPENDENT_AMBULATORY_CARE_PROVIDER_SITE_OTHER): Payer: 59 | Admitting: Family Medicine

## 2019-11-17 ENCOUNTER — Other Ambulatory Visit: Payer: Self-pay

## 2019-11-17 VITALS — BP 134/79 | HR 85 | Temp 98.5°F | Ht 71.0 in | Wt 312.0 lb

## 2019-11-17 DIAGNOSIS — E119 Type 2 diabetes mellitus without complications: Secondary | ICD-10-CM

## 2019-11-17 DIAGNOSIS — K760 Fatty (change of) liver, not elsewhere classified: Secondary | ICD-10-CM

## 2019-11-17 DIAGNOSIS — Z9189 Other specified personal risk factors, not elsewhere classified: Secondary | ICD-10-CM

## 2019-11-17 DIAGNOSIS — F3289 Other specified depressive episodes: Secondary | ICD-10-CM

## 2019-11-17 DIAGNOSIS — E559 Vitamin D deficiency, unspecified: Secondary | ICD-10-CM

## 2019-11-17 DIAGNOSIS — D696 Thrombocytopenia, unspecified: Secondary | ICD-10-CM

## 2019-11-17 DIAGNOSIS — E1169 Type 2 diabetes mellitus with other specified complication: Secondary | ICD-10-CM | POA: Diagnosis not present

## 2019-11-17 DIAGNOSIS — Z6841 Body Mass Index (BMI) 40.0 and over, adult: Secondary | ICD-10-CM

## 2019-11-17 DIAGNOSIS — E785 Hyperlipidemia, unspecified: Secondary | ICD-10-CM
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
## 2019-11-18 NOTE — Progress Notes (Signed)
Chief Complaint:   OBESITY Scott Wolfe is here to discuss his progress with his obesity treatment plan along with follow-up of his obesity related diagnoses. Scott Wolfe is on the Category 4 Plan and states he is following his eating plan approximately 99% of the time. Scott Wolfe states he is walking and doing yard work 1 times per week.  Today's visit was #: 2 Starting weight: 326 lbs Starting date: 11/03/2019 Today's weight: 312 lbs Today's date: 11/17/2019 Total lbs lost to date: 14 lbs Total lbs lost since last in-office visit: 14 lbs  Interim History: Scott Wolfe reports he is able to follow the plan well.  He likes the structure of it.  He had a week off after our last visit and was able to plan.  He made great notes (see scanned into media).  He says that he feels better already. He is interested in CBT for emotional eating.  Subjective:   1. Vitamin D deficiency Scott Wolfe's Vitamin D level was 26.4 on 11/03/2019. He is not currently taking vit D. He denies nausea, vomiting or muscle weakness.  2. Type 2 diabetes mellitus without complication, without long-term current use of insulin (Scott Wolfe) Scott Wolfe is currently not on any medications for diabetes.  This is a new diagnosis.  Lab Results  Component Value Date   HGBA1C 6.6 (H) 11/03/2019   Lab Results  Component Value Date   LDLCALC 83 11/03/2019   CREATININE 0.91 11/03/2019   Lab Results  Component Value Date   INSULIN 125.0 (H) 11/03/2019   3. Hyperlipidemia associated with type 2 diabetes mellitus (Scott) Scott Wolfe has hyperlipidemia and has been trying to improve his cholesterol levels with intensive lifestyle modification including a low saturated fat diet, exercise and weight loss. He denies any chest pain, claudication or myalgias.  He is followed by Cardiology.  He is on Repatha.  Lab Results  Component Value Date   ALT 113 (H) 11/03/2019   AST 64 (H) 11/03/2019   ALKPHOS 67 11/03/2019   BILITOT 0.9 11/03/2019   Lab Results  Component Value Date     CHOL 159 11/03/2019   HDL 51 11/03/2019   LDLCALC 83 11/03/2019   TRIG 144 11/03/2019   CHOLHDL 3.1 11/03/2019   4. Thrombocytopenia (Scott Wolfe) Scott Wolfe has thrombocytopenia in the setting of known fatty liver disease.  5. NAFLD (nonalcoholic fatty liver disease) Scott Wolfe's last ultrasound was 05/16/2018, reviewed with patient.  6. Other depression, with emotional eating Scott Wolfe is struggling with emotional eating and using food for comfort to the extent that it is negatively impacting his health. He has been working on behavior modification techniques to help reduce his emotional eating and has been unsuccessful. He shows no sign of suicidal or homicidal ideations.  He tends to emotionally eat at night.  He has a history of ADHD and is taking Vyvanse.  7. At risk for nausea Scott Wolfe is at risk for nausea due to new GLP-1 RA.  Assessment/Plan:   1. Vitamin D deficiency Low Vitamin D level contributes to fatigue and are associated with obesity, breast, and colon cancer. He agrees to continue to take prescription Vitamin D @50 ,000 IU every week and will follow-up for routine testing of Vitamin D, at least 2-3 times per year to avoid over-replacement.  Orders - Vitamin D, Ergocalciferol, (DRISDOL) 1.25 MG (50000 UNIT) CAPS capsule; Take 1 capsule (50,000 Units total) by mouth every 7 (seven) days.  Dispense: 4 capsule; Refill: 0  2. Type 2 diabetes mellitus without complication, without long-term  current use of insulin (HCC) Good blood sugar control is important to decrease the likelihood of diabetic complications such as nephropathy, neuropathy, limb loss, blindness, coronary artery disease, and death. Intensive lifestyle modification including diet, exercise and weight loss are the first line of treatment for diabetes.   Orders - Semaglutide,0.25 or 0.5MG /DOS, (OZEMPIC, 0.25 OR 0.5 MG/DOSE,) 2 MG/1.5ML SOPN; Inject 0.25 mg into the skin once a week.  Dispense: 4 pen; Refill: 0  3. Hyperlipidemia  associated with type 2 diabetes mellitus (Scott Wolfe) Cardiovascular risk and specific lipid/LDL goals reviewed.  We discussed several lifestyle modifications today and Scott Wolfe will continue to work on diet, exercise and weight loss efforts. Orders and follow up as documented in patient record.   Counseling Intensive lifestyle modifications are the first line treatment for this issue. . Dietary changes: Increase soluble fiber. Decrease simple carbohydrates. . Exercise changes: Moderate to vigorous-intensity aerobic activity 150 minutes per week if tolerated. . Lipid-lowering medications: see documented in medical record.  4. Thrombocytopenia (Scott Wolfe) Will continue to monitor.  5. NAFLD (nonalcoholic fatty liver disease) Current treatment plan is effective, no change in therapy. Orders and follow up as documented in patient record.  6. Other depression, with emotional eating Patient was referred to Dr. Mallie Mussel, our Bariatric Psychologist, for evaluation due to his elevated PHQ-9 score and significant struggles with emotional eating.  7. At risk for nausea Scott Wolfe was given approximately 15 minutes of nausea prevention counseling today. Scott Wolfe is at risk for nausea due to his new or current medication. He was encouraged to titrate his medication slowly, make sure to stay hydrated, eat smaller portions throughout the day, and avoid high fat meals.   8. Class 3 severe obesity with serious comorbidity and body mass index (BMI) of 40.0 to 44.9 in adult, unspecified obesity type (HCC) Scott Wolfe is currently in the action stage of change. As such, his goal is to continue with weight loss efforts. He has agreed to the Category 4 Plan.   Exercise goals: As is.  Behavioral modification strategies: emotional eating strategies.  Scott Wolfe has agreed to follow-up with our clinic in 2 weeks. He was informed of the importance of frequent follow-up visits to maximize his success with intensive lifestyle modifications for his  multiple health conditions.   Objective:   Blood pressure 134/79, pulse 85, temperature 98.5 F (36.9 C), temperature source Oral, height 5\' 11"  (1.803 m), weight (!) 312 lb (141.5 kg), SpO2 99 %. Body mass index is 43.52 kg/m.  General: Cooperative, alert, well developed, in no acute distress. HEENT: Conjunctivae and lids unremarkable. Cardiovascular: Regular rhythm.  Lungs: Normal work of breathing. Neurologic: No focal deficits.   Lab Results  Component Value Date   CREATININE 0.91 11/03/2019   BUN 14 11/03/2019   NA 141 11/03/2019   K 4.5 11/03/2019   CL 102 11/03/2019   CO2 24 11/03/2019   Lab Results  Component Value Date   ALT 113 (H) 11/03/2019   AST 64 (H) 11/03/2019   ALKPHOS 67 11/03/2019   BILITOT 0.9 11/03/2019   Lab Results  Component Value Date   HGBA1C 6.6 (H) 11/03/2019   Lab Results  Component Value Date   INSULIN 125.0 (H) 11/03/2019   Lab Results  Component Value Date   TSH 1.910 11/03/2019   Lab Results  Component Value Date   CHOL 159 11/03/2019   HDL 51 11/03/2019   LDLCALC 83 11/03/2019   TRIG 144 11/03/2019   CHOLHDL 3.1 11/03/2019   Lab  Results  Component Value Date   WBC 6.7 11/03/2019   HGB 17.4 11/03/2019   HCT 48.9 11/03/2019   MCV 97 11/03/2019   PLT 134 (L) 11/03/2019   Lab Results  Component Value Date   IRON 124 11/03/2019   TIBC 419 11/03/2019   FERRITIN 269 11/03/2019   Attestation Statements:   Reviewed by clinician on day of visit: allergies, medications, problem list, medical history, surgical history, family history, social history, and previous encounter notes.  I, Water quality scientist, CMA, am acting as Location manager for [employer], DO.  I have reviewed the above documentation for accuracy and completeness, and I agree with the above. Briscoe Deutscher, DO

## 2019-11-19 MED ORDER — VITAMIN D (ERGOCALCIFEROL) 1.25 MG (50000 UNIT) PO CAPS: 50000.0000 [IU] | ORAL_CAPSULE | ORAL | 0 refills | Status: DC

## 2019-11-19 MED ORDER — OZEMPIC (0.25 OR 0.5 MG/DOSE) 2 MG/1.5ML ~~LOC~~ SOPN: 0.2500 mg | PEN_INJECTOR | SUBCUTANEOUS | 0 refills | Status: DC

## 2019-11-25 ENCOUNTER — Ambulatory Visit (INDEPENDENT_AMBULATORY_CARE_PROVIDER_SITE_OTHER): Payer: 59 | Admitting: Psychology

## 2019-11-25 ENCOUNTER — Other Ambulatory Visit: Payer: Self-pay

## 2019-11-25 DIAGNOSIS — F3289 Other specified depressive episodes: Secondary | ICD-10-CM | POA: Diagnosis not present

## 2019-11-25 DIAGNOSIS — F909 Attention-deficit hyperactivity disorder, unspecified type: Secondary | ICD-10-CM

## 2019-11-25 NOTE — Progress Notes (Signed)
  Office: [phone]  /  Fax: [phone]    Date: December 09, 2019   Appointment Start Time: 2:30pm Duration: 32 minutes Provider: Glennie Isle, Psy.D. Type of Session: Individual Therapy  Location of Patient: Home Location of Provider: Provider's Home Type of Contact: Telepsychological Visit via [employer]  Session Content: Scott Wolfe is a 59 y.o. male presenting via [place] for a follow-up appointment to address the previously established treatment goal of decreasing emotional eating. Today's appointment was a telepsychological visit due to COVID-19. [name] provided verbal consent for today's telepsychological appointment and he is aware he is responsible for securing confidentiality on his end of the session. Prior to proceeding with today's appointment, [name]'s physical location at the time of this appointment was obtained as well a phone number he could be reached at in the event of technical difficulties. [name] and this provider participated in today's telepsychological service.   This provider conducted a brief check-in. [name] shared about recent events. Regarding self-care, he shared listening to audio books. In addition, [name] stated he is following the meal plan. Moreover, emotional and physical hunger were reviewed. Psychoeducation regarding triggers for emotional eating was provided. [name] was provided a handout, and encouraged to utilize the handout between now and the next appointment to increase awareness of triggers and frequency. [name] agreed. This provider also discussed behavioral strategies for specific triggers, such as placing the utensil down when conversing to avoid mindless eating. [name] provided verbal consent during today's appointment for this provider to send a handout about triggers via e-mail. [name] was receptive to today's appointment as evidenced by openness to sharing, responsiveness to feedback, and willingness to explore triggers for emotional eating.  Mental Status  Examination:  Appearance: well groomed and appropriate hygiene  Behavior: appropriate to circumstances Mood: euthymic Affect: mood congruent Speech: normal in rate, volume, and tone Eye Contact: appropriate Psychomotor Activity: appropriate Gait: unable to assess Thought Process: linear, logical, and goal directed  Thought Content/Perception: no hallucinations, delusions, bizarre thinking or behavior reported or observed and no evidence of suicidal and homicidal ideation, plan, and intent Orientation: time, person, place and purpose of appointment Memory/Concentration: memory, attention, language, and fund of knowledge intact  Insight/Judgment: good  Interventions:  Conducted a brief chart review Provided empathic reflections and validation Reviewed content from the previous session Employed supportive psychotherapy interventions to facilitate reduced distress and to improve coping skills with identified stressors Employed motivational interviewing skills to assess patient's willingness/desire to adhere to recommended medical treatments and assignments Psychoeducation provided regarding triggers for emotional eating  DSM-5 Diagnosis(es): 311 (F32.8) Other Specified Depressive Disorder, Emotional Eating Behaviors and 314.01 (F90.9) Unspecified Attention-Deficit/Hyperactivity Disorder   Treatment Goal & Progress: During the initial appointment with this provider, the following treatment goal was established: decrease emotional eating. [name] has demonstrated progress in his goal as evidenced by increased awareness of hunger patterns.   Plan: The next appointment will be scheduled in 3-4 weeks, which will be via MyChart Video Visit. The next session will focus on working towards the established treatment goal.

## 2019-12-09 ENCOUNTER — Other Ambulatory Visit: Payer: Self-pay

## 2019-12-09 ENCOUNTER — Ambulatory Visit (INDEPENDENT_AMBULATORY_CARE_PROVIDER_SITE_OTHER): Payer: 59 | Admitting: Psychology

## 2019-12-09 DIAGNOSIS — F3289 Other specified depressive episodes: Secondary | ICD-10-CM | POA: Diagnosis not present

## 2019-12-09 DIAGNOSIS — F909 Attention-deficit hyperactivity disorder, unspecified type: Secondary | ICD-10-CM

## 2019-12-10 ENCOUNTER — Ambulatory Visit (INDEPENDENT_AMBULATORY_CARE_PROVIDER_SITE_OTHER): Payer: 59 | Admitting: Family Medicine

## 2019-12-10 ENCOUNTER — Other Ambulatory Visit: Payer: Self-pay

## 2019-12-10 VITALS — BP 126/78 | HR 71 | Temp 97.7°F | Ht 71.0 in | Wt 304.0 lb

## 2019-12-10 DIAGNOSIS — K760 Fatty (change of) liver, not elsewhere classified: Secondary | ICD-10-CM

## 2019-12-10 DIAGNOSIS — E559 Vitamin D deficiency, unspecified: Secondary | ICD-10-CM

## 2019-12-10 DIAGNOSIS — E119 Type 2 diabetes mellitus without complications: Secondary | ICD-10-CM | POA: Diagnosis not present

## 2019-12-10 DIAGNOSIS — Z6841 Body Mass Index (BMI) 40.0 and over, adult: Secondary | ICD-10-CM

## 2019-12-10 DIAGNOSIS — Z9189 Other specified personal risk factors, not elsewhere classified: Secondary | ICD-10-CM | POA: Diagnosis not present

## 2019-12-10 DIAGNOSIS — E785 Hyperlipidemia, unspecified: Secondary | ICD-10-CM

## 2019-12-10 DIAGNOSIS — E1169 Type 2 diabetes mellitus with other specified complication: Secondary | ICD-10-CM | POA: Diagnosis not present

## 2019-12-10 DIAGNOSIS — F5102 Adjustment insomnia: Secondary | ICD-10-CM

## 2019-12-10 MED ORDER — VITAMIN D (ERGOCALCIFEROL) 1.25 MG (50000 UNIT) PO CAPS: 50000.0000 [IU] | ORAL_CAPSULE | ORAL | 0 refills | Status: DC

## 2019-12-10 MED ORDER — OZEMPIC (0.25 OR 0.5 MG/DOSE) 2 MG/1.5ML ~~LOC~~ SOPN: 0.5000 mg | PEN_INJECTOR | SUBCUTANEOUS | 0 refills | Status: DC

## 2019-12-10 NOTE — Progress Notes (Signed)
Chief Complaint:   OBESITY Scott Wolfe is here to discuss his progress with his obesity treatment plan along with follow-up of his obesity related diagnoses. Scott Wolfe is on the Category 4 Plan and states he is following his eating plan approximately 100% of the time. Scott Wolfe states he is doing yard work and getting in steps.  Today's visit was #: 3 Starting weight: 326 lbs Starting date: 11/03/2019 Today's weight: 304 lbs Today's date: 12/10/2019 Total lbs lost to date: 22 lbs Total lbs lost since last in-office visit: 8 lbs  Interim History: Scott Wolfe is following the plan well.  Denies polyphagia but endorses nighttime cravings and insomnia.  He recently passed a kidney stone.  Subjective:   1. Type 2 diabetes mellitus without complication, without long-term current use of insulin (HCC) Medications reviewed. He is taking Ozempic 0.25 mg weekly and tolerating it well.   Lab Results  Component Value Date   HGBA1C 6.6 (H) 11/03/2019   Lab Results  Component Value Date   LDLCALC 83 11/03/2019   CREATININE 0.91 11/03/2019   Lab Results  Component Value Date   INSULIN 125.0 (H) 11/03/2019   2. Hyperlipidemia associated with type 2 diabetes mellitus ([place]) Scott Wolfe has hyperlipidemia and has been trying to improve his cholesterol levels with intensive lifestyle modification including a low saturated fat diet, exercise and weight loss. He denies any chest pain, claudication or myalgias.  He takes Repatha every 2 weeks.  Lab Results  Component Value Date   ALT 113 (H) 11/03/2019   AST 64 (H) 11/03/2019   ALKPHOS 67 11/03/2019   BILITOT 0.9 11/03/2019   Lab Results  Component Value Date   CHOL 159 11/03/2019   HDL 51 11/03/2019   LDLCALC 83 11/03/2019   TRIG 144 11/03/2019   CHOLHDL 3.1 11/03/2019   3. NAFLD (nonalcoholic fatty liver disease) Zaim had an ultrasound on 05/16/2018, showing fatty liver disease.   4. Vitamin D deficiency Scott Wolfe's Vitamin D level was 26.4 on 11/03/2019. He is  currently taking prescription vitamin D 50,000 IU each week. He denies nausea, vomiting or muscle weakness.  5. Insomnia Scott Wolfe reports difficulty sleeping. He admits that he used to eat to help him go to sleep. Since changing his habits, insomnia has worsened.   Assessment/Plan:   1. Type 2 diabetes mellitus without complication, without long-term current use of insulin (HCC) Good blood sugar control is important to decrease the likelihood of diabetic complications such as nephropathy, neuropathy, limb loss, blindness, coronary artery disease, and death. Intensive lifestyle modification including diet, exercise and weight loss are the first line of treatment for diabetes.  Will increase Ozempic to 0.5 mg weekly.  Orders - Semaglutide,0.25 or 0.5MG /DOS, (OZEMPIC, 0.25 OR 0.5 MG/DOSE,) 2 MG/1.5ML SOPN; Inject 0.5 mg into the skin once a week.  Dispense: 4 pen; Refill: 0  2. Hyperlipidemia associated with type 2 diabetes mellitus ([place]) Cardiovascular risk and specific lipid/LDL goals reviewed.  We discussed several lifestyle modifications today and Scott Wolfe will continue to work on diet, exercise and weight loss efforts. Orders and follow up as documented in patient record.   Counseling Intensive lifestyle modifications are the first line treatment for this issue. . Dietary changes: Increase soluble fiber. Decrease simple carbohydrates. . Exercise changes: Moderate to vigorous-intensity aerobic activity 150 minutes per week if tolerated. . Lipid-lowering medications: see documented in medical record.  3. NAFLD (nonalcoholic fatty liver disease) NAFLD Fibrosis Score: 2.86 points Correlated Fibrosis Severity: F3-F4  NAFLD FIBROSIS SCORE  Calculation  uses Age, BMI, impaired fasting glucose, AST, ALT, platelets, albumin.  Depending on score and local prevalence of advanced fibrosis, the score can be used to reliably predict (with high 80-low 90% accuracy) which patients are unlikely to have cellular  evidence of fibrosis on biopsy.  NAFLD Score                       Correlated Fibrosis Severity < -1.455                       F0-F2 -1.455 - 0.675                       Indeterminant score > 0.675                       F3-F4  Fibrosis Severity Scale  F0 = no fibrosis F1 = mild fibrosis F2 = moderate fibrosis F3 = severe fibrosis F4 = cirrhosis   There is not an FDA approved medication available yet to treat NAFLD, but treating comorbid conditions plus weight loss will improve NAFLD. LIVER-DIRECTED treatments include: liraglutide or other GLP1RA, pioglitazone, and vitamin E.   4. Vitamin D deficiency Low Vitamin D level contributes to fatigue and are associated with obesity, breast, and colon cancer. He agrees to continue to take prescription Vitamin D @50 ,000 IU every week and will follow-up for routine testing of Vitamin D, at least 2-3 times per year to avoid over-replacement.  Orders - Vitamin D, Ergocalciferol, (DRISDOL) 1.25 MG (50000 UNIT) CAPS capsule; Take 1 capsule (50,000 Units total) by mouth every 7 (seven) days.  Dispense: 4 capsule; Refill: 0  5. Insomnia The problem of recurrent insomnia was discussed. Orders and follow up as documented in patient record. Counseling: Intensive lifestyle modifications are the first line treatment for this issue. We discussed several lifestyle modifications today and he will continue to work on diet, exercise and weight loss efforts.  He will also consider trazodone in the future.  Counseling  Limit or avoid alcohol, caffeinated beverages, and cigarettes, especially close to bedtime.   Do not eat a large meal or eat spicy foods right before bedtime. This can lead to digestive discomfort that can make it hard for you to sleep.  Keep a sleep diary to help you and your health care provider figure out what could be causing your insomnia.  . Make your bedroom a dark, comfortable place where it is easy to fall asleep. ? Put up shades or  blackout curtains to block light from outside. ? Use a white noise machine to block noise. ? Keep the temperature cool. . Limit screen use before bedtime. This includes: ? Watching TV. ? Using your smartphone, tablet, or computer. . Stick to a routine that includes going to bed and waking up at the same times every day and night. This can help you fall asleep faster. Consider making a quiet activity, such as reading, part of your nighttime routine. . Try to avoid taking naps during the day so that you sleep better at night. . Get out of bed if you are still awake after 15 minutes of trying to sleep. Keep the lights down, but try reading or doing a quiet activity. When you feel sleepy, go back to bed.  6. At risk for deficient intake of food Scott Wolfe was given approximately 15 minutes of deficit intake of food prevention counseling today. Scott Wolfe is at  risk for eating too few calories based on current food recall. He was encouraged to focus on meeting caloric and protein goals according to his recommended meal plan.   7. Class 3 severe obesity with serious comorbidity and body mass index (BMI) of 40.0 to 44.9 in adult, unspecified obesity type (HCC) Scott Wolfe is currently in the action stage of change. As such, his goal is to continue with weight loss efforts. He has agreed to the Category 4 Plan.   Exercise goals: For substantial health benefits, adults should do at least 150 minutes (2 hours and 30 minutes) a week of moderate-intensity, or 75 minutes (1 hour and 15 minutes) a week of vigorous-intensity aerobic physical activity, or an equivalent combination of moderate- and vigorous-intensity aerobic activity. Aerobic activity should be performed in episodes of at least 10 minutes, and preferably, it should be spread throughout the week.  Behavioral modification strategies: increasing lean protein intake and increasing water intake.  Scott Wolfe has agreed to follow-up with our clinic in 2 weeks. He was informed  of the importance of frequent follow-up visits to maximize his success with intensive lifestyle modifications for his multiple health conditions.   Objective:   Blood pressure 126/78, pulse 71, temperature 97.7 F (36.5 C), temperature source Oral, height 5\' 11"  (1.803 m), weight (!) 304 lb (137.9 kg), SpO2 100 %. Body mass index is 42.4 kg/m.  General: Cooperative, alert, well developed, in no acute distress. HEENT: Conjunctivae and lids unremarkable. Cardiovascular: Regular rhythm.  Lungs: Normal work of breathing. Neurologic: No focal deficits.   Lab Results  Component Value Date   CREATININE 0.91 11/03/2019   BUN 14 11/03/2019   NA 141 11/03/2019   K 4.5 11/03/2019   CL 102 11/03/2019   CO2 24 11/03/2019   Lab Results  Component Value Date   ALT 113 (H) 11/03/2019   AST 64 (H) 11/03/2019   ALKPHOS 67 11/03/2019   BILITOT 0.9 11/03/2019   Lab Results  Component Value Date   HGBA1C 6.6 (H) 11/03/2019   Lab Results  Component Value Date   INSULIN 125.0 (H) 11/03/2019   Lab Results  Component Value Date   TSH 1.910 11/03/2019   Lab Results  Component Value Date   CHOL 159 11/03/2019   HDL 51 11/03/2019   LDLCALC 83 11/03/2019   TRIG 144 11/03/2019   CHOLHDL 3.1 11/03/2019   Lab Results  Component Value Date   WBC 6.7 11/03/2019   HGB 17.4 11/03/2019   HCT 48.9 11/03/2019   MCV 97 11/03/2019   PLT 134 (L) 11/03/2019   Lab Results  Component Value Date   IRON 124 11/03/2019   TIBC 419 11/03/2019   FERRITIN 269 11/03/2019   Attestation Statements:   Reviewed by clinician on day of visit: allergies, medications, problem list, medical history, surgical history, family history, social history, and previous encounter notes.  I, Water quality scientist, CMA, am acting as Location manager for [employer], DO.  I have reviewed the above documentation for accuracy and completeness, and I agree with the above. Briscoe Deutscher, DO

## 2019-12-14 ENCOUNTER — Encounter (INDEPENDENT_AMBULATORY_CARE_PROVIDER_SITE_OTHER): Payer: Self-pay | Admitting: Family Medicine

## 2019-12-14 DIAGNOSIS — E1169 Type 2 diabetes mellitus with other specified complication: Secondary | ICD-10-CM | POA: Insufficient documentation

## 2019-12-14 DIAGNOSIS — K760 Fatty (change of) liver, not elsewhere classified: Secondary | ICD-10-CM | POA: Insufficient documentation

## 2019-12-14 DIAGNOSIS — F5102 Adjustment insomnia: Secondary | ICD-10-CM | POA: Insufficient documentation

## 2019-12-14 DIAGNOSIS — E559 Vitamin D deficiency, unspecified: Secondary | ICD-10-CM | POA: Insufficient documentation

## 2019-12-14 DIAGNOSIS — Z6839 Body mass index (BMI) 39.0-39.9, adult: Secondary | ICD-10-CM | POA: Insufficient documentation

## 2019-12-21 ENCOUNTER — Telehealth: Payer: Self-pay | Admitting: Pharmacist

## 2019-12-21 NOTE — Telephone Encounter (Signed)
Called patient to set up lipid labs. He will be seeing PCP on Monday. Called PCP office to request a lipid panel. Per office, lipid panel is ordered. They put in note to send results to our office.

## 2019-12-21 NOTE — Progress Notes (Signed)
Office: [phone]  /  Fax: [phone]    Date: Jan 04, 2020   Appointment Start Time: 2:28pm Duration: 35 minutes Provider: Glennie Isle, Psy.D. Type of Session: Individual Therapy  Location of Patient: Home Location of Provider: Provider's Home Type of Contact: Telepsychological Visit via MyChart Video Visit  Session Content: L…Scott Wolfe is a 59 y.Scott Wolfe. male presenting via MyChart Video Visit for a follow-up appointment to address the previously established treatment goal of decreasing emotional eating. Today's appointment was a telepsychological visit due to COVID-19. [name] provided verbal consent for today's telepsychological appointment and he is aware he is responsible for securing confidentiality on his end of the session. Prior to proceeding with today's appointment, [name]'s physical location at the time of this appointment was obtained as well a phone number he could be reached at in the event of technical difficulties. [name] and this provider participated in today's telepsychological service.   [name] acknowledged understanding that for today's appointment and future telepsychological appointments, MyChart Video Visit will be utilized. He also verbally acknowledged understanding that the information outlined in the telepsychological informed consent form signed at the onset of treatment would still be applicable despite the change in the videoconferencing platform.   This provider conducted a brief check-in. [name] discussed being busy with work. He indicated he continues to lose weight. Triggers for emotional eating were reviewed. Recent triggers were processed. He noted a reduction in emotional eating. Psychoeducation regarding mindfulness was provided to assist with coping. A handout was provided to [name] with further information regarding mindfulness, including exercises. This provider also explained the benefit of mindfulness as it relates to emotional eating. [name] was encouraged to engage in the  provided exercises between now and the next appointment with this provider. [name] agreed. During today's appointment, [name] was led through a mindfulness exercise involving his senses. [name] provided verbal consent during today's appointment for this provider to send a handout about mindfulness via e-mail.  [name] was receptive to today's appointment as evidenced by openness to sharing, responsiveness to feedback, and willingness to engage in mindfulness exercises to assist with coping.  Mental Status Examination:  Appearance: well groomed and appropriate hygiene  Behavior: appropriate to circumstances Mood: euthymic Affect: mood congruent Speech: normal in rate, volume, and tone Eye Contact: appropriate Psychomotor Activity: appropriate Gait: unable to assess Thought Process: linear, logical, and goal directed  Thought Content/Perception: no hallucinations, delusions, bizarre thinking or behavior reported or observed and no evidence of suicidal and homicidal ideation, plan, and intent Orientation: time, person, place and purpose of appointment Memory/Concentration: memory, attention, language, and fund of knowledge intact  Insight/Judgment: good  Interventions:  Conducted a brief chart review Provided empathic reflections and validation Reviewed content from the previous session Employed supportive psychotherapy interventions to facilitate reduced distress and to improve coping skills with identified stressors Employed motivational interviewing skills to assess patient's willingness/desire to adhere to recommended medical treatments and assignments Psychoeducation provided regarding mindfulness Engaged patient in mindfulness exercise(s) Employed acceptance and commitment interventions to emphasize mindfulness and acceptance without struggle  DSM-5 Diagnosis(es): 311 (F32.8) Other Specified Depressive Disorder, Emotional Eating Behaviors and 314.01 (F90.9) Unspecified  Attention-Deficit/Hyperactivity Disorder   Treatment Goal & Progress: During the initial appointment with this provider, the following treatment goal was established: decrease emotional eating. [name] has demonstrated progress in his goal as evidenced by increased awareness of hunger patterns and increased awareness of triggers for emotional eating. [name] also demonstrates willingness to engage in mindfulness exercises.  Plan: The next appointment will be  scheduled in approximately two weeks, which will be via MyChart Video Visit. The next session will focus on working towards the established treatment goal.

## 2019-12-29 ENCOUNTER — Telehealth: Payer: Self-pay | Admitting: Pharmacist

## 2019-12-29 MED ORDER — ROSUVASTATIN CALCIUM 10 MG PO TABS: 10.0000 mg | ORAL_TABLET | Freq: Every day | ORAL | 3 refills | Status: DC

## 2019-12-29 NOTE — Telephone Encounter (Signed)
Patient had lipid panel drawn at PCP office yesterday. LDL was 63, TG 133 (per [facility]) AST 35 ALT 61  LDL is at goal, not as big of a reduction as expected however. Patient was started on Repatha a little less than 2 months ago. Spoke with patient and he states he had stopped taking rosuvastatin. Had discussed this with PCP who stated the Repatha was supposed to replace it due to issues with elevated LFT.  LFT are normal off of statin. LFT never were > 3x ULN. Discussed that although LDL is at goal, we do get a better cardiovascular benefit when we use the two medications together (or so we assume because the medications were studied together and we do .  Down 27lb- seeing the weight loss clinic. Feels great. I congratulated him on his success and to keep up the good work.  I would advised that patient take both rosuvastatin 10mg  and Repatha 140 mg, but told patient I would check with Dr. Acie Fredrickson to see what his option is. If he thinks its ok to stop statin.

## 2019-12-29 NOTE — Telephone Encounter (Signed)
Will reduce rosuvastatin from 20 to 10mg . He will resume taking statin. Spoke to patient who is in agreement. He has lab work next week and quarterly through [employer] loss. Will keep close eye on LFTs.

## 2019-12-29 NOTE — Telephone Encounter (Signed)
I agree with continuing both if his liver tolerates.  Perhaps a lower dose statin Continue to follow up in lipid clinic

## 2020-01-04 ENCOUNTER — Telehealth (INDEPENDENT_AMBULATORY_CARE_PROVIDER_SITE_OTHER): Payer: 59 | Admitting: Psychology

## 2020-01-04 ENCOUNTER — Ambulatory Visit (INDEPENDENT_AMBULATORY_CARE_PROVIDER_SITE_OTHER): Payer: 59 | Admitting: Bariatrics

## 2020-01-04 ENCOUNTER — Other Ambulatory Visit: Payer: Self-pay

## 2020-01-04 ENCOUNTER — Encounter (INDEPENDENT_AMBULATORY_CARE_PROVIDER_SITE_OTHER): Payer: Self-pay | Admitting: Bariatrics

## 2020-01-04 VITALS — BP 145/78 | HR 81 | Temp 98.2°F | Ht 71.0 in | Wt 296.0 lb

## 2020-01-04 DIAGNOSIS — E785 Hyperlipidemia, unspecified: Secondary | ICD-10-CM

## 2020-01-04 DIAGNOSIS — K760 Fatty (change of) liver, not elsewhere classified: Secondary | ICD-10-CM | POA: Diagnosis not present

## 2020-01-04 DIAGNOSIS — E119 Type 2 diabetes mellitus without complications: Secondary | ICD-10-CM

## 2020-01-04 DIAGNOSIS — F909 Attention-deficit hyperactivity disorder, unspecified type: Secondary | ICD-10-CM

## 2020-01-04 DIAGNOSIS — Z6841 Body Mass Index (BMI) 40.0 and over, adult: Secondary | ICD-10-CM

## 2020-01-04 DIAGNOSIS — E1169 Type 2 diabetes mellitus with other specified complication: Secondary | ICD-10-CM | POA: Diagnosis not present

## 2020-01-04 DIAGNOSIS — F3289 Other specified depressive episodes: Secondary | ICD-10-CM | POA: Diagnosis not present

## 2020-01-04 DIAGNOSIS — Z9189 Other specified personal risk factors, not elsewhere classified: Secondary | ICD-10-CM

## 2020-01-04 DIAGNOSIS — G4709 Other insomnia: Secondary | ICD-10-CM

## 2020-01-04 DIAGNOSIS — E559 Vitamin D deficiency, unspecified: Secondary | ICD-10-CM | POA: Diagnosis not present

## 2020-01-04 MED ORDER — VITAMIN D (ERGOCALCIFEROL) 1.25 MG (50000 UNIT) PO CAPS: 50000.0000 [IU] | ORAL_CAPSULE | ORAL | 0 refills | Status: DC

## 2020-01-04 MED ORDER — DOXEPIN HCL 150 MG PO CAPS: 150.0000 mg | ORAL_CAPSULE | Freq: Every day | ORAL | 0 refills | Status: DC

## 2020-01-04 MED ORDER — OZEMPIC (0.25 OR 0.5 MG/DOSE) 2 MG/1.5ML ~~LOC~~ SOPN: 0.5000 mg | PEN_INJECTOR | SUBCUTANEOUS | 0 refills | Status: DC

## 2020-01-04 NOTE — Progress Notes (Signed)
Chief Complaint:   OBESITY Scott Wolfe is here to discuss his progress with his obesity treatment plan along with follow-up of his obesity related diagnoses. Scott Wolfe is on the Category 4 Plan and states he is following his eating plan approximately 95% of the time. Scott Wolfe states he is walking 4,000 steps 5 times per week.  Today's visit was #: 4 Starting weight: 326 lbs Starting date: 11/03/2019 Today's weight: 296 lbs Today's date: 01/04/2020 Total lbs lost to date: 30 Total lbs lost since last in-office visit: 8  Interim History: Scott Wolfe is down 8 lbs and doing very well overall.  Subjective:   Hyperlipidemia associated with type 2 diabetes mellitus ([place]). Scott Wolfe is on Crestor and Repatha.  Lab Results  Component Value Date   CHOL 159 11/03/2019   HDL 51 11/03/2019   LDLCALC 83 11/03/2019   TRIG 144 11/03/2019   CHOLHDL 3.1 11/03/2019   Lab Results  Component Value Date   ALT 113 (H) 11/03/2019   AST 64 (H) 11/03/2019   ALKPHOS 67 11/03/2019   BILITOT 0.9 11/03/2019   The 10-year ASCVD risk score Scott Bussing DC Jr., et al., 2013) is: 16.8%   Values used to calculate the score:     Age: 59 years     Sex: Male     Is Non-Hispanic African American: No     Diabetic: Yes     Tobacco smoker: No     Systolic Blood Pressure: [id number] mmHg     Is BP treated: Yes     HDL Cholesterol: 51 mg/dL     Total Cholesterol: 159 mg/dL  Type 2 diabetes mellitus without complication, without long-term current use of insulin ([place]). Last A1c high at 6.6 on 11/03/2019.   Lab Results  Component Value Date   HGBA1C 6.6 (H) 11/03/2019   Lab Results  Component Value Date   LDLCALC 83 11/03/2019   CREATININE 0.91 11/03/2019   Lab Results  Component Value Date   INSULIN 125.0 (H) 11/03/2019   NAFLD (nonalcoholic fatty liver disease). NAFLD fibrosis score is 2.86 points = F3-F4 severity. She is currently taking GLP-1 receptor agonist and is working on weight loss.  Vitamin D deficiency. Scott Wolfe is  taking prescription Vitamin D weekly. Last Vitamin D 26.4 on 11/03/2019.  Other insomnia. Scott Wolfe has decided he could use an aid for sleep.  At risk for hypoglycemia. Scott Wolfe is at increased risk for hypoglycemia due to diabetes mellitus type II.   Assessment/Plan:   Hyperlipidemia associated with type 2 diabetes mellitus ([place]). Cardiovascular risk and specific lipid/LDL goals reviewed.  We discussed several lifestyle modifications today and Scott Wolfe will continue to work on diet, exercise and weight loss efforts. Orders and follow up as documented in patient record. Scott Wolfe will continue his medications as directed.  Counseling Intensive lifestyle modifications are the first line treatment for this issue. . Dietary changes: Increase soluble fiber. Decrease simple carbohydrates. . Exercise changes: Moderate to vigorous-intensity aerobic activity 150 minutes per week if tolerated. . Lipid-lowering medications: see documented in medical record.  Type 2 diabetes mellitus without complication, without long-term current use of insulin ([place]). Good blood sugar control is important to decrease the likelihood of diabetic complications such as nephropathy, neuropathy, limb loss, blindness, coronary artery disease, and death. Intensive lifestyle modification including diet, exercise and weight loss are the first line of treatment for diabetes. Scott Wolfe was given a prescription for Semaglutide,0.25 or 0.5MG /DOS, (OZEMPIC, 0.25 OR 0.5 MG/DOSE,) 2 MG/1.5ML SOPN inject 0.5 mg into  the skin once weekly, 1 month supply with 0 refills. Insulin, random, C-peptide levels ordered today.   NAFLD (nonalcoholic fatty liver disease). Scott Wolfe will continue to work on weight loss and exercise.  Vitamin D deficiency. Low Vitamin D level contributes to fatigue and are associated with obesity, breast, and colon cancer. He was given a prescription for Vitamin D, Ergocalciferol, (DRISDOL) 1.25 MG (50000 UNIT) CAPS capsule every week #4 with 0  refills and VITAMIN D 25 Hydroxy (Vit-D Deficiency, Fractures) level was ordered today.   Other insomnia. The problem of recurrent insomnia was discussed. Orders and follow up as documented in patient record. Counseling: Intensive lifestyle modifications are the first line treatment for this issue. We discussed several lifestyle modifications today and he will continue to work on diet, exercise and weight loss efforts. Prescription was given for doxepin (SINEQUAN) 150 MG capsule 1 PO daily at bedtime #30 with 0 refills.  Counseling  Limit or avoid alcohol, caffeinated beverages, and cigarettes, especially close to bedtime.   Do not eat a large meal or eat spicy foods right before bedtime. This can lead to digestive discomfort that can make it hard for you to sleep.  Keep a sleep diary to help you and your health care provider figure out what could be causing your insomnia.  . Make your bedroom a dark, comfortable place where it is easy to fall asleep. ? Put up shades or blackout curtains to block light from outside. ? Use a white noise machine to block noise. ? Keep the temperature cool. . Limit screen use before bedtime. This includes: ? Watching TV. ? Using your smartphone, tablet, or computer. . Stick to a routine that includes going to bed and waking up at the same times every day and night. This can help you fall asleep faster. Consider making a quiet activity, such as reading, part of your nighttime routine. . Try to avoid taking naps during the day so that you sleep better at night. . Get out of bed if you are still awake after 15 minutes of trying to sleep. Keep the lights down, but try reading or doing a quiet activity. When you feel sleepy, go back to bed.   At risk for hypoglycemia. Scott Wolfe was given approximately 15 minutes of counseling today regarding prevention of hypoglycemia. He was advised of symptoms of hypoglycemia. Scott Wolfe was instructed to avoid skipping meals, eat regular  protein rich meals and schedule low calorie snacks as needed.   Repetitive spaced learning was employed today to elicit superior memory formation and behavioral change.  Class 3 severe obesity with serious comorbidity and body mass index (BMI) of 40.0 to 44.9 in adult, unspecified obesity type ([place]).  Scott Wolfe is currently in the action stage of change. As such, his goal is to continue with weight loss efforts. He has agreed to the Category 4 Plan.   He will work on meal planning and intentional eating.  Exercise goals: All adults should avoid inactivity. Some physical activity is better than none, and adults who participate in any amount of physical activity gain some health benefits.  Behavioral modification strategies: increasing lean protein intake, decreasing simple carbohydrates, increasing vegetables, increasing water intake, decreasing eating out, no skipping meals, meal planning and cooking strategies, keeping healthy foods in the home and planning for success.  Scott Wolfe has agreed to follow-up with our clinic in 2 weeks. He was informed of the importance of frequent follow-up visits to maximize his success with intensive lifestyle modifications for his  multiple health conditions.   Scott Wolfe was informed we would discuss his lab results at his next visit unless there is a critical issue that needs to be addressed sooner. Scott Wolfe agreed to keep his next visit at the agreed upon time to discuss these results.  Objective:   Blood pressure (!) 145/78, pulse 81, temperature 98.2 F (36.8 C), height 5\' 11"  (1.803 m), weight 296 lb (134.3 kg), SpO2 97 %. Body mass index is 41.28 kg/m.  General: Cooperative, alert, well developed, in no acute distress. HEENT: Conjunctivae and lids unremarkable. Cardiovascular: Regular rhythm.  Lungs: Normal work of breathing. Neurologic: No focal deficits.   Lab Results  Component Value Date   CREATININE 0.91 11/03/2019   BUN 14 11/03/2019   NA 141 11/03/2019    K 4.5 11/03/2019   CL 102 11/03/2019   CO2 24 11/03/2019   Lab Results  Component Value Date   ALT 113 (H) 11/03/2019   AST 64 (H) 11/03/2019   ALKPHOS 67 11/03/2019   BILITOT 0.9 11/03/2019   Lab Results  Component Value Date   HGBA1C 6.6 (H) 11/03/2019   Lab Results  Component Value Date   INSULIN 125.0 (H) 11/03/2019   Lab Results  Component Value Date   TSH 1.910 11/03/2019   Lab Results  Component Value Date   CHOL 159 11/03/2019   HDL 51 11/03/2019   LDLCALC 83 11/03/2019   TRIG 144 11/03/2019   CHOLHDL 3.1 11/03/2019   Lab Results  Component Value Date   WBC 6.7 11/03/2019   HGB 17.4 11/03/2019   HCT 48.9 11/03/2019   MCV 97 11/03/2019   PLT 134 (L) 11/03/2019   Lab Results  Component Value Date   IRON 124 11/03/2019   TIBC 419 11/03/2019   FERRITIN 269 11/03/2019   Attestation Statements:   Reviewed by clinician on day of visit: allergies, medications, problem list, medical history, surgical history, family history, social history, and previous encounter notes.  Migdalia Dk, am acting as Location manager for [employer], DO   I have reviewed the above documentation for accuracy and completeness, and I agree with the above. Jearld Lesch, DO

## 2020-01-05 LAB — VITAMIN D 25 HYDROXY (VIT D DEFICIENCY, FRACTURES): Vit D, 25-Hydroxy: 58.1 ng/mL (ref 30.0–100.0)

## 2020-01-05 LAB — INSULIN, RANDOM: INSULIN: 7.5 u[IU]/mL (ref 2.6–24.9)

## 2020-01-05 LAB — C-PEPTIDE: C-Peptide: 5.6 ng/mL — ABNORMAL HIGH (ref 1.1–4.4)

## 2020-01-06 NOTE — Progress Notes (Signed)
  Office: [phone]  /  Fax: [phone]    Date: Jan 20, 2020   Appointment Start Time: 7:54am Duration: 37 minutes Provider: Glennie Isle, Psy.D. Type of Session: Individual Therapy  Location of Patient: Home Location of Provider: Provider's Home Type of Contact: Telepsychological Visit via MyChart Video Visit  Session Content: Scott Wolfe is a 59 y.o. male presenting via MyChart Video Visit for a follow-up appointment to address the previously established treatment goal of decreasing emotional eating. Today's appointment was a telepsychological visit due to COVID-19. Scott Wolfe provided verbal consent for today's telepsychological appointment and he is aware he is responsible for securing confidentiality on his end of the session. Prior to proceeding with today's appointment, Scott Wolfe's physical location at the time of this appointment was obtained as well a phone number he could be reached at in the event of technical difficulties. Scott Wolfe and this provider participated in today's telepsychological service.   This provider conducted a brief check-in. Scott Wolfe discussed "struggling" with mindfulness. This was explored and processed. Scott Wolfe was led through a mindfulness exercise (A Taste of Mindfulness) and his experience was processed. Scott Wolfe provided verbal consent during today's appointment for this provider to send the handout for today's exercise via e-mail. This provider also discussed the utilization of YouTube for mindfulness exercises (e.g., exercises by Merri Ray). Scott Wolfe was receptive to today's appointment as evidenced by openness to sharing, responsiveness to feedback, and willingness to continue engaging in mindfulness exercises.  Mental Status Examination:  Appearance: well groomed and appropriate hygiene  Behavior: appropriate to circumstances Mood: euthymic Affect: mood congruent Speech: normal in rate, volume, and tone Eye Contact: appropriate Psychomotor Activity: appropriate Gait: unable to  assess Thought Process: linear, logical, and goal directed  Thought Content/Perception: no hallucinations, delusions, bizarre thinking or behavior reported or observed and no evidence of suicidal and homicidal ideation, plan, and intent Orientation: time, person, place and purpose of appointment Memory/Concentration: memory, attention, language, and fund of knowledge intact  Insight/Judgment: good  Interventions:  Conducted a brief chart review Provided empathic reflections and validation Reviewed content from the previous session Employed supportive psychotherapy interventions to facilitate reduced distress and to improve coping skills with identified stressors Employed motivational interviewing skills to assess patient's willingness/desire to adhere to recommended medical treatments and assignments Engaged patient in mindfulness exercise(s) Employed acceptance and commitment interventions to emphasize mindfulness and acceptance without struggle  DSM-5 Diagnosis(es): 311 (F32.8) Other Specified Depressive Disorder, Emotional Eating Behaviors and 314.01 (F90.9) Unspecified Attention-Deficit/Hyperactivity Disorder   Treatment Goal & Progress: During the initial appointment with this provider, the following treatment goal was established: decrease emotional eating. Scott Wolfe has demonstrated progress in his goal as evidenced by increased awareness of hunger patterns and increased awareness of triggers for emotional eating. Scott Wolfe also continues to demonstrate willingness to engage in learned skill(s).  Plan: The next appointment will be scheduled in 3-4 weeks, which will be via MyChart Video Visit. The next session will focus on working towards the established treatment goal.

## 2020-01-20 ENCOUNTER — Telehealth (INDEPENDENT_AMBULATORY_CARE_PROVIDER_SITE_OTHER): Payer: 59 | Admitting: Psychology

## 2020-01-20 ENCOUNTER — Other Ambulatory Visit: Payer: Self-pay

## 2020-01-20 ENCOUNTER — Ambulatory Visit (INDEPENDENT_AMBULATORY_CARE_PROVIDER_SITE_OTHER): Payer: 59 | Admitting: Family Medicine

## 2020-01-20 ENCOUNTER — Encounter (INDEPENDENT_AMBULATORY_CARE_PROVIDER_SITE_OTHER): Payer: Self-pay | Admitting: Family Medicine

## 2020-01-20 VITALS — BP 117/75 | HR 73 | Temp 98.0°F | Ht 71.0 in | Wt 291.0 lb

## 2020-01-20 DIAGNOSIS — E119 Type 2 diabetes mellitus without complications: Secondary | ICD-10-CM | POA: Diagnosis not present

## 2020-01-20 DIAGNOSIS — E559 Vitamin D deficiency, unspecified: Secondary | ICD-10-CM

## 2020-01-20 DIAGNOSIS — Z9189 Other specified personal risk factors, not elsewhere classified: Secondary | ICD-10-CM | POA: Diagnosis not present

## 2020-01-20 DIAGNOSIS — F909 Attention-deficit hyperactivity disorder, unspecified type: Secondary | ICD-10-CM

## 2020-01-20 DIAGNOSIS — G4709 Other insomnia: Secondary | ICD-10-CM

## 2020-01-20 DIAGNOSIS — E1169 Type 2 diabetes mellitus with other specified complication: Secondary | ICD-10-CM | POA: Diagnosis not present

## 2020-01-20 DIAGNOSIS — E785 Hyperlipidemia, unspecified: Secondary | ICD-10-CM

## 2020-01-20 DIAGNOSIS — F3289 Other specified depressive episodes: Secondary | ICD-10-CM | POA: Diagnosis not present

## 2020-01-20 DIAGNOSIS — Z6841 Body Mass Index (BMI) 40.0 and over, adult: Secondary | ICD-10-CM

## 2020-01-20 MED ORDER — TRAZODONE HCL 50 MG PO TABS: 50.0000 mg | ORAL_TABLET | Freq: Every evening | ORAL | 0 refills | Status: DC | PRN

## 2020-01-20 NOTE — Progress Notes (Signed)
Chief Complaint:   OBESITY Scott Wolfe is here to discuss his progress with his obesity treatment plan along with follow-up of his obesity related diagnoses. Scott Wolfe is on the Category 4 Plan and states he is following his eating plan approximately 90% of the time. Scott Wolfe states he is getting 4000-5000 steps per day.  Today's visit was #: 5 Starting weight: 326 lbs Starting date: 11/03/2019 Today's weight: 291 lbs Today's date: 01/20/2020 Total lbs lost to date: 35 lbs Total lbs lost since last in-office visit: 5 lbs  Interim History: Scott Wolfe is still having fatigue since having COVID.  He is working 12 hour workdays; says he is working too much.  He says he is taking a 1 week vacation during the first week of June.  He reports that doxepin was too strong for him.  He felt sedated for a few days after taking it.  Subjective:   1. Hyperlipidemia associated with type 2 diabetes mellitus ([place]) Scott Wolfe has hyperlipidemia and has been trying to improve his cholesterol levels with intensive lifestyle modification including a low saturated fat diet, exercise and weight loss. He denies any chest pain, claudication or myalgias.  He is taking Crestor 10 mg daily.  Lab Results  Component Value Date   ALT 113 (H) 11/03/2019   AST 64 (H) 11/03/2019   ALKPHOS 67 11/03/2019   BILITOT 0.9 11/03/2019   Lab Results  Component Value Date   CHOL 159 11/03/2019   HDL 51 11/03/2019   LDLCALC 83 11/03/2019   TRIG 144 11/03/2019   CHOLHDL 3.1 11/03/2019   2. Type 2 diabetes mellitus without complication, without long-term current use of insulin ([facility]) Medications reviewed. Diabetic ROS: no polyuria or polydipsia, no chest pain, dyspnea or TIA's, no numbness, tingling or pain in extremities.  He is taking Ozempic 0.5 mg weekly.  His insulin has come down from 125 to 7.5.  Lab Results  Component Value Date   HGBA1C 6.6 (H) 11/03/2019   Lab Results  Component Value Date   LDLCALC 83 11/03/2019   CREATININE 0.91  11/03/2019   Lab Results  Component Value Date   INSULIN 7.5 01/04/2020   INSULIN 125.0 (H) 11/03/2019   3. Vitamin D deficiency Scott Wolfe's Vitamin D level was 58.1 on 01/04/2020. He is currently taking OTC vitamin D 2000 IU each day. He denies nausea, vomiting or muscle weakness.  4. Other insomnia Started doxepin after last visit, but it was too strong for him.  5. Other depression, with emotional eating Scott Wolfe has been working with Scott Wolfe.  6. At risk for complication associated with hypotension The patient is at a higher than average risk of hypotension due to weight loss.  Assessment/Plan:   1. Hyperlipidemia associated with type 2 diabetes mellitus ([place]) Cardiovascular risk and specific lipid/LDL goals reviewed.  We discussed several lifestyle modifications today and Scott Wolfe will continue to work on diet, exercise and weight loss efforts. Orders and follow up as documented in patient record.  Will check labs today.  Counseling Intensive lifestyle modifications are the first line treatment for this issue. . Dietary changes: Increase soluble fiber. Decrease simple carbohydrates. . Exercise changes: Moderate to vigorous-intensity aerobic activity 150 minutes per week if tolerated. . Lipid-lowering medications: see documented in medical record.  Orders - Lipid panel - CK  2. Type 2 diabetes mellitus without complication, without long-term current use of insulin ([facility]) Good blood sugar control is important to decrease the likelihood of diabetic complications such as nephropathy, neuropathy,  limb loss, blindness, coronary artery disease, and death. Intensive lifestyle modification including diet, exercise and weight loss are the first line of treatment for diabetes.   3. Vitamin D deficiency Low Vitamin D level contributes to fatigue and are associated with obesity, breast, and colon cancer. He agrees to continue to take OTC Vitamin D @2 ,000 IU daily and will follow-up for routine  testing of Vitamin D, at least 2-3 times per year to avoid over-replacement.  4. Other insomnia Will try Scott Wolfe on trazodone for his insomnia.  Orders - traZODone (DESYREL) 50 MG tablet; Take 1 tablet (50 mg total) by mouth at bedtime as needed for sleep.  Dispense: 30 tablet; Refill: 0  5. Other depression, with emotional eating Recommend Scott Wolfe continue to work with Scott Wolfe.  Behavior modification techniques were discussed today to help Scott Wolfe deal with his emotional/non-hunger eating behaviors.  Orders and follow up as documented in patient record.   6. At risk for complication associated with hypotension Scott Wolfe was given approximately 15 minutes of education and counseling today to help avoid hypotension. We discussed risks of hypotension with weight loss and signs of hypotension such as feeling lightheaded or unsteady.  Repetitive spaced learning was employed today to elicit superior memory formation and behavioral change.  7. Class 3 severe obesity with serious comorbidity and body mass index (BMI) of 40.0 to 44.9 in adult, unspecified obesity type ([facility]) Scott Wolfe is currently in the action stage of change. As such, his goal is to continue with weight loss efforts. He has agreed to the Category 4 Plan.   Exercise goals: For substantial health benefits, adults should do at least 150 minutes (2 hours and 30 minutes) a week of moderate-intensity, or 75 minutes (1 hour and 15 minutes) a week of vigorous-intensity aerobic physical activity, or an equivalent combination of moderate- and vigorous-intensity aerobic activity. Aerobic activity should be performed in episodes of at least 10 minutes, and preferably, it should be spread throughout the week.  Behavioral modification strategies: increasing lean protein intake and increasing water intake.  Scott Wolfe has agreed to follow-up with our clinic in 2 weeks. He was informed of the importance of frequent follow-up visits to maximize his success with intensive  lifestyle modifications for his multiple health conditions.   Scott Wolfe was informed we would discuss his lab results at his next visit unless there is a critical issue that needs to be addressed sooner. Scott Wolfe agreed to keep his next visit at the agreed upon time to discuss these results.  Objective:   Blood pressure 117/75, pulse 73, temperature 98 F (36.7 C), temperature source Oral, height 5\' 11"  (1.803 m), weight 291 lb (132 kg), SpO2 99 %. Body mass index is 40.59 kg/m.  General: Cooperative, alert, well developed, in no acute distress. HEENT: Conjunctivae and lids unremarkable. Cardiovascular: Regular rhythm.  Lungs: Normal work of breathing. Neurologic: No focal deficits.   Lab Results  Component Value Date   CREATININE 0.91 11/03/2019   BUN 14 11/03/2019   NA 141 11/03/2019   K 4.5 11/03/2019   CL 102 11/03/2019   CO2 24 11/03/2019   Lab Results  Component Value Date   ALT 113 (H) 11/03/2019   AST 64 (H) 11/03/2019   ALKPHOS 67 11/03/2019   BILITOT 0.9 11/03/2019   Lab Results  Component Value Date   HGBA1C 6.6 (H) 11/03/2019   Lab Results  Component Value Date   INSULIN 7.5 01/04/2020   INSULIN 125.0 (H) 11/03/2019   Lab Results  Component Value Date   TSH 1.910 11/03/2019   Lab Results  Component Value Date   CHOL 159 11/03/2019   HDL 51 11/03/2019   LDLCALC 83 11/03/2019   TRIG 144 11/03/2019   CHOLHDL 3.1 11/03/2019   Lab Results  Component Value Date   WBC 6.7 11/03/2019   HGB 17.4 11/03/2019   HCT 48.9 11/03/2019   MCV 97 11/03/2019   PLT 134 (L) 11/03/2019   Lab Results  Component Value Date   IRON 124 11/03/2019   TIBC 419 11/03/2019   FERRITIN 269 11/03/2019   Attestation Statements:   Reviewed by clinician on day of visit: allergies, medications, problem list, medical history, surgical history, family history, social history, and previous encounter notes.  I, Water quality scientist, CMA, am acting as Location manager for [employer],  DO.  I have reviewed the above documentation for accuracy and completeness, and I agree with the above. Briscoe Deutscher, DO

## 2020-01-21 LAB — CK: Total CK: 233 U/L (ref 41–331)

## 2020-01-21 LAB — LIPID PANEL
Chol/HDL Ratio: 1.7 ratio (ref 0.0–5.0)
Cholesterol, Total: 83 mg/dL — ABNORMAL LOW (ref 100–199)
HDL: 49 mg/dL (ref >39)
LDL Chol Calc (NIH): 17 mg/dL (ref 0–99)
Triglycerides: 87 mg/dL (ref 0–149)
VLDL Cholesterol Cal: 17 mg/dL (ref 5–40)

## 2020-01-26 LAB — HEPATIC FUNCTION PANEL
ALT: 54 IU/L — ABNORMAL HIGH (ref 0–44)
AST: 34 IU/L (ref 0–40)
Albumin: 4.7 g/dL (ref 3.8–4.9)
Alkaline Phosphatase: 58 IU/L (ref 48–121)
Bilirubin Total: 1 mg/dL (ref 0.0–1.2)
Bilirubin, Direct: 0.29 mg/dL (ref 0.00–0.40)
Total Protein: 6.6 g/dL (ref 6.0–8.5)

## 2020-01-26 LAB — SPECIMEN STATUS REPORT

## 2020-01-30 ENCOUNTER — Other Ambulatory Visit (INDEPENDENT_AMBULATORY_CARE_PROVIDER_SITE_OTHER): Payer: Self-pay | Admitting: Bariatrics

## 2020-01-30 DIAGNOSIS — E559 Vitamin D deficiency, unspecified: Secondary | ICD-10-CM

## 2020-02-01 ENCOUNTER — Other Ambulatory Visit (INDEPENDENT_AMBULATORY_CARE_PROVIDER_SITE_OTHER): Payer: Self-pay | Admitting: Bariatrics

## 2020-02-01 DIAGNOSIS — G4709 Other insomnia: Secondary | ICD-10-CM

## 2020-02-02 NOTE — Progress Notes (Unsigned)
Office: [phone]  /  Fax: [phone]    Date: February 15, 2020   Appointment Start Time: *** Duration: *** minutes Provider: Glennie Isle, Psy.D. Type of Session: Individual Therapy  Location of Patient: {gbptloc:23249} Location of Provider: {Location of Service:22491} Type of Contact: Telepsychological Visit via {gbtelepsych:23399}  Session Content: Scott Wolfe is a 59 y.o. male presenting via [employer] for a follow-up appointment to address the previously established treatment goal of decreasing emotional eating. Today's appointment was a telepsychological visit due to COVID-19. Scott Wolfe provided verbal consent for today's telepsychological appointment and he is aware he is responsible for securing confidentiality on his end of the session. Prior to proceeding with today's appointment, Scott Wolfe's physical location at the time of this appointment was obtained as well a phone number he could be reached at in the event of technical difficulties. Ka and this provider participated in today's telepsychological service.   This provider conducted a brief check-in and verbally administered the PHQ-9 and GAD-7. *** Scott Wolfe was receptive to today's appointment as evidenced by openness to sharing, responsiveness to feedback, and {gbreceptiveness:23401}.  Mental Status Examination:  Appearance: {Appearance:22431} Behavior: {Behavior:22445} Mood: {gbmood:21757} Affect: {Affect:22436} Speech: {Speech:22432} Eye Contact: {Eye Contact:22433} Psychomotor Activity: {Motor Activity:22434} Gait: {gbgait:23404} Thought Process: {thought process:22448}  Thought Content/Perception: {disturbances:22451} Orientation: {Orientation:22437} Memory/Concentration: {gbcognition:22449} Insight/Judgment: {Insight:22446}  Structured Assessments Results: The Patient Health Questionnaire-9 (PHQ-9) is a self-report measure that assesses symptoms and severity of depression over the course of the last two weeks. Scott Wolfe obtained a  score of *** suggesting {GBPHQ9SEVERITY:21752}. Scott Wolfe finds the endorsed symptoms to be {gbphq9difficulty:21754}. [0= Not at all; 1= Several days; 2= More than half the days; 3= Nearly every day] Little interest or pleasure in doing things ***  Feeling down, depressed, or hopeless ***  Trouble falling or staying asleep, or sleeping too much ***  Feeling tired or having little energy ***  Poor appetite or overeating ***  Feeling bad about yourself --- or that you are a failure or have let yourself or your family down ***  Trouble concentrating on things, such as reading the newspaper or watching television ***  Moving or speaking so slowly that other people could have noticed? Or the opposite --- being so fidgety or restless that you have been moving around a lot more than usual ***  Thoughts that you would be better off dead or hurting yourself in some way ***  PHQ-9 Score ***    The Generalized Anxiety Disorder-7 (GAD-7) is a brief self-report measure that assesses symptoms of anxiety over the course of the last two weeks. Scott Wolfe obtained a score of *** suggesting {gbgad7severity:21753}. Scott Wolfe finds the endorsed symptoms to be {gbphq9difficulty:21754}. [0= Not at all; 1= Several days; 2= Over half the days; 3= Nearly every day] Feeling nervous, anxious, on edge ***  Not being able to stop or control worrying ***  Worrying too much about different things ***  Trouble relaxing ***  Being so restless that it's hard to sit still ***  Becoming easily annoyed or irritable ***  Feeling afraid as if something awful might happen ***  GAD-7 Score ***   Interventions:  {Interventions for Progress Notes:23405}  DSM-5 Diagnosis(es): 311 (F32.8) Other Specified Depressive Disorder, Emotional Eating Behaviors and 314.01 (F90.9) Unspecified Attention-Deficit/Hyperactivity Disorder   Treatment Goal & Progress: During the initial appointment with this provider, the following treatment goal was established:  decrease emotional eating. Scott Wolfe has demonstrated progress in his goal as evidenced by {gbtxprogress:22839}. Scott Wolfe also {gbtxprogress2:22951}.  Plan: The next  appointment will be scheduled in {gbweeks:21758}, which will be {gbtxmodality:23402}. The next session will focus on {Plan for Next Appointment:23400}.

## 2020-02-03 ENCOUNTER — Other Ambulatory Visit: Payer: Self-pay | Admitting: *Deleted

## 2020-02-03 NOTE — Patient Outreach (Signed)
[name] Care Management  02/03/2020  Scott Wolfe 02/09/1961 [id number]   Subjective: Telephone call to patient's home  / mobile number, no answer, left HIPAA compliant voicemail message, and requested call back.    Objective: Per KPN (Knowledge Performance Now, point of care tool) and chart review, patient has had no recent hospitalizations or ED visits.   Patient also has a history of diabetes, hyperlipidemia, insomnia, OSA, Vitamin D deficiency, lung nodule, and Other depression, with emotional eating.      Assessment: Received [employer] referral on 01/13/2020.  Referral source: Scott Wolfe.   Referral reason: high cost Scott Wolfe for program engagement.   Screening  follow up pending patient contact.     Plan:   RNCM will send unsuccessful outreach  letter, [facility] pamphlet, will call patient for 2nd telephone outreach attempt within 4 business days, screening follow up, and will proceed with case closure within 10 business days if no return call.      Scott Wolfe H. Annia Friendly, BSN, [place] [facility] Telephonic CM Phone: [phone] Fax: [phone]

## 2020-02-09 ENCOUNTER — Ambulatory Visit: Payer: Self-pay | Admitting: *Deleted

## 2020-02-10 ENCOUNTER — Other Ambulatory Visit: Payer: Self-pay | Admitting: *Deleted

## 2020-02-10 NOTE — Patient Outreach (Signed)
[facility]) Care Management  02/10/2020  Scott Wolfe 1960-10-18 [id number]   Subjective: Telephone call to patient's home  / mobile number, no answer, left HIPAA compliant voicemail message, and requested call back.    Objective: Per KPN (Knowledge Performance Now, point of care tool) and chart review, patient has had no recent hospitalizations or ED visits.   Patient also has a history of diabetes, hyperlipidemia, insomnia, OSA, Vitamin D deficiency, lung nodule, and Other depression, with emotional eating.      Assessment: Received [employer] referral on 01/13/2020.  Referral source: [name].   Referral reason: high cost [name] for program engagement.   Screening  follow up pending patient contact.     Plan:   RNCM hasl sent unsuccessful outreach  letter, [facility] pamphlet, will call patient for 3rd telephone outreach attempt within 3 business days, screening follow up, and will proceed with case closure within 10 business days if no return call, after 4th unsuccessful outreach call.      Jilliann Subramanian H. Annia Friendly, BSN, [place] [facility] Telephonic CM Phone: [phone] Fax: [phone]

## 2020-02-14 ENCOUNTER — Other Ambulatory Visit (INDEPENDENT_AMBULATORY_CARE_PROVIDER_SITE_OTHER): Payer: Self-pay | Admitting: Bariatrics

## 2020-02-14 DIAGNOSIS — E119 Type 2 diabetes mellitus without complications: Secondary | ICD-10-CM

## 2020-02-15 ENCOUNTER — Ambulatory Visit: Payer: Self-pay | Admitting: *Deleted

## 2020-02-15 ENCOUNTER — Encounter (INDEPENDENT_AMBULATORY_CARE_PROVIDER_SITE_OTHER): Payer: Self-pay | Admitting: Family Medicine

## 2020-02-15 ENCOUNTER — Telehealth (INDEPENDENT_AMBULATORY_CARE_PROVIDER_SITE_OTHER): Payer: Self-pay | Admitting: Psychology

## 2020-02-15 ENCOUNTER — Other Ambulatory Visit: Payer: Self-pay

## 2020-02-15 ENCOUNTER — Ambulatory Visit (INDEPENDENT_AMBULATORY_CARE_PROVIDER_SITE_OTHER): Payer: 59 | Admitting: Family Medicine

## 2020-02-15 VITALS — BP 131/85 | HR 79 | Temp 97.9°F | Ht 71.0 in | Wt 288.0 lb

## 2020-02-15 DIAGNOSIS — Z6841 Body Mass Index (BMI) 40.0 and over, adult: Secondary | ICD-10-CM

## 2020-02-15 DIAGNOSIS — R5383 Other fatigue: Secondary | ICD-10-CM

## 2020-02-15 DIAGNOSIS — E119 Type 2 diabetes mellitus without complications: Secondary | ICD-10-CM | POA: Diagnosis not present

## 2020-02-15 DIAGNOSIS — Z9189 Other specified personal risk factors, not elsewhere classified: Secondary | ICD-10-CM | POA: Diagnosis not present

## 2020-02-15 MED ORDER — OZEMPIC (0.25 OR 0.5 MG/DOSE) 2 MG/1.5ML ~~LOC~~ SOPN: 0.5000 mg | PEN_INJECTOR | SUBCUTANEOUS | 0 refills | Status: DC

## 2020-02-15 NOTE — Progress Notes (Signed)
Chief Complaint:   OBESITY Scott Wolfe is here to discuss his progress with his obesity treatment plan along with follow-up of his obesity related diagnoses. Scott Wolfe is on the Category 4 Plan and states he is following his eating plan approximately 90% of the time. Scott Wolfe states he is walking 8,000 steps 4 times per week.  Today's visit was #: 6 Starting weight: 326 lbs Starting date: 11/03/2019 Today's weight: 288 lbs Today's date: 02/15/2020 Total lbs lost to date: 38 lbs Total lbs lost since last in-office visit: 3 lbs  Interim History: Scott Wolfe is down 138 pounds today.  He says he forgot to take his Ozempic for a few days and was hungry.  He has been doing more stress eating.  He is still working with Dr. Mallie Mussel, and says he finds it very helpful.  Subjective:   1. Type 2 diabetes mellitus without complication, without long-term current use of insulin (HCC) Medications reviewed. Diabetic ROS: no polyuria or polydipsia, no chest pain, dyspnea or TIA's, no numbness, tingling or pain in extremities.   Lab Results  Component Value Date   HGBA1C 6.6 (H) 11/03/2019   Lab Results  Component Value Date   LDLCALC 17 01/20/2020   CREATININE 0.91 11/03/2019   Lab Results  Component Value Date   INSULIN 7.5 01/04/2020   INSULIN 125.0 (H) 11/03/2019   2. Other fatigue Scott Wolfe has had increased fatigue since early January, attributed to post COVID symptoms.  He reports monthly episodes of increased fatigue.  Stable.  He is fully vaccinated.  3. At risk for heart disease Scott Wolfe is at a higher than average risk for cardiovascular disease due to obesity.   Assessment/Plan:   1. Type 2 diabetes mellitus without complication, without long-term current use of insulin (HCC) Good blood sugar control is important to decrease the likelihood of diabetic complications such as nephropathy, neuropathy, limb loss, blindness, coronary artery disease, and death. Intensive lifestyle modification including diet,  exercise and weight loss are the first line of treatment for diabetes.   Orders - Semaglutide,0.25 or 0.5MG /DOS, (OZEMPIC, 0.25 OR 0.5 MG/DOSE,) 2 MG/1.5ML SOPN; Inject 0.375 mLs (0.5 mg total) into the skin once a week.  Dispense: 4 pen; Refill: 0  2. Other fatigue Will continue to monitor.  3. At risk for heart disease Scott Wolfe was given approximately 15 minutes of coronary artery disease prevention counseling today. He is 59 y.o. male and has risk factors for heart disease including obesity. We discussed intensive lifestyle modifications today with an emphasis on specific weight loss instructions and strategies.   Repetitive spaced learning was employed today to elicit superior memory formation and behavioral change.  4. Class 3 severe obesity with serious comorbidity and body mass index (BMI) of 40.0 to 44.9 in adult, unspecified obesity type (HCC) Scott Wolfe is currently in the action stage of change. As such, his goal is to continue with weight loss efforts. He has agreed to the Category 4 Plan.   Exercise goals: For substantial health benefits, adults should do at least 150 minutes (2 hours and 30 minutes) a week of moderate-intensity, or 75 minutes (1 hour and 15 minutes) a week of vigorous-intensity aerobic physical activity, or an equivalent combination of moderate- and vigorous-intensity aerobic activity. Aerobic activity should be performed in episodes of at least 10 minutes, and preferably, it should be spread throughout the week.  Behavioral modification strategies: increasing lean protein intake and increasing water intake.  Scott Wolfe has agreed to follow-up with our clinic in  3 weeks. He was informed of the importance of frequent follow-up visits to maximize his success with intensive lifestyle modifications for his multiple health conditions.   Objective:   Blood pressure 131/85, pulse 79, temperature 97.9 F (36.6 C), temperature source Oral, height 5\' 11"  (1.803 m), weight 288 lb (130.6  kg), SpO2 98 %. Body mass index is 40.17 kg/m.  General: Cooperative, alert, well developed, in no acute distress. HEENT: Conjunctivae and lids unremarkable. Cardiovascular: Regular rhythm.  Lungs: Normal work of breathing. Neurologic: No focal deficits.   Lab Results  Component Value Date   CREATININE 0.91 11/03/2019   BUN 14 11/03/2019   NA 141 11/03/2019   K 4.5 11/03/2019   CL 102 11/03/2019   CO2 24 11/03/2019   Lab Results  Component Value Date   ALT 54 (H) 01/20/2020   AST 34 01/20/2020   ALKPHOS 58 01/20/2020   BILITOT 1.0 01/20/2020   Lab Results  Component Value Date   HGBA1C 6.6 (H) 11/03/2019   Lab Results  Component Value Date   INSULIN 7.5 01/04/2020   INSULIN 125.0 (H) 11/03/2019   Lab Results  Component Value Date   TSH 1.910 11/03/2019   Lab Results  Component Value Date   CHOL 83 (L) 01/20/2020   HDL 49 01/20/2020   LDLCALC 17 01/20/2020   TRIG 87 01/20/2020   CHOLHDL 1.7 01/20/2020   Lab Results  Component Value Date   WBC 6.7 11/03/2019   HGB 17.4 11/03/2019   HCT 48.9 11/03/2019   MCV 97 11/03/2019   PLT 134 (L) 11/03/2019   Lab Results  Component Value Date   IRON 124 11/03/2019   TIBC 419 11/03/2019   FERRITIN 269 11/03/2019   Attestation Statements:   Reviewed by clinician on day of visit: allergies, medications, problem list, medical history, surgical history, family history, social history, and previous encounter notes.  I, Water quality scientist, CMA, am acting as transcriptionist for Briscoe Deutscher, DO  I have reviewed the above documentation for accuracy and completeness, and I agree with the above. Briscoe Deutscher, DO

## 2020-02-17 ENCOUNTER — Ambulatory Visit: Payer: Self-pay | Admitting: *Deleted

## 2020-02-18 ENCOUNTER — Other Ambulatory Visit: Payer: Self-pay | Admitting: *Deleted

## 2020-02-18 NOTE — Patient Outreach (Signed)
[facility]) Care Management  02/18/2020  Scott Wolfe 05-16-1961 [id number]   Subjective: Telephone call to patient's home / mobile number, spoke with patient, and HIPAA verified.  Discussed [employer] health plan referral  follow up, patient voiced understanding, and is in agreement to follow up at a later time.   Patient states he is doing well, currently is at work, has a deadline to meet, and unable to talk at this time.   States he has received [place] [employer] information, has this RNCM''s contact information, and will give  RNCM a call at a later time.     Objective:Per KPN (Knowledge Performance Now, point of care tool) and chart review,patient has had no recent hospitalizations or ED visits. Patient also has a history of diabetes, hyperlipidemia, insomnia, OSA, Vitamin D deficiency, lung nodule, and Other depression, with emotional eating.     Assessment: Received [employer] referral on 01/13/2020. Referral source: [name]. Referral reason: high cost [name] for program engagement. Screening follow up pending patient contact.     Plan:RNCM has sent unsuccessful outreach letter, [facility] pamphlet, will call patient for 4th telephone outreach attempt within 30 business days, screening follow up, and will proceed with case closure within 10 business days if no return call, after 4th unsuccessful outreach call.      Tameka Hoiland H. Annia Friendly, BSN, [place] [facility] Telephonic CM Phone: [phone] Fax: [phone]

## 2020-02-19 ENCOUNTER — Other Ambulatory Visit: Payer: Self-pay | Admitting: Family Medicine

## 2020-02-19 DIAGNOSIS — R911 Solitary pulmonary nodule: Secondary | ICD-10-CM

## 2020-03-08 ENCOUNTER — Ambulatory Visit
Admission: RE | Admit: 2020-03-08 | Discharge: 2020-03-08 | Disposition: A | Discharge status: Home / Self Care | Payer: 59 | Source: Ambulatory Visit | Attending: Family Medicine | Admitting: Family Medicine

## 2020-03-08 DIAGNOSIS — R911 Solitary pulmonary nodule: Secondary | ICD-10-CM

## 2020-03-09 ENCOUNTER — Other Ambulatory Visit: Payer: Self-pay

## 2020-03-09 ENCOUNTER — Ambulatory Visit (INDEPENDENT_AMBULATORY_CARE_PROVIDER_SITE_OTHER): Payer: 59 | Admitting: Family Medicine

## 2020-03-09 ENCOUNTER — Encounter (INDEPENDENT_AMBULATORY_CARE_PROVIDER_SITE_OTHER): Payer: Self-pay | Admitting: Family Medicine

## 2020-03-09 VITALS — BP 127/80 | HR 76 | Temp 98.1°F | Ht 71.0 in | Wt 288.0 lb

## 2020-03-09 DIAGNOSIS — E1169 Type 2 diabetes mellitus with other specified complication: Secondary | ICD-10-CM

## 2020-03-09 DIAGNOSIS — I10 Essential (primary) hypertension: Secondary | ICD-10-CM

## 2020-03-09 DIAGNOSIS — Z9189 Other specified personal risk factors, not elsewhere classified: Secondary | ICD-10-CM | POA: Diagnosis not present

## 2020-03-09 DIAGNOSIS — Z6841 Body Mass Index (BMI) 40.0 and over, adult: Secondary | ICD-10-CM

## 2020-03-09 DIAGNOSIS — F3289 Other specified depressive episodes: Secondary | ICD-10-CM

## 2020-03-09 MED ORDER — OZEMPIC (0.25 OR 0.5 MG/DOSE) 2 MG/1.5ML ~~LOC~~ SOPN: 0.5000 mg | PEN_INJECTOR | SUBCUTANEOUS | 0 refills | Status: DC

## 2020-03-10 ENCOUNTER — Encounter (INDEPENDENT_AMBULATORY_CARE_PROVIDER_SITE_OTHER): Payer: Self-pay | Admitting: Family Medicine

## 2020-03-10 ENCOUNTER — Telehealth (INDEPENDENT_AMBULATORY_CARE_PROVIDER_SITE_OTHER): Payer: Self-pay | Admitting: Psychology

## 2020-03-10 MED ORDER — INSULIN PEN NEEDLE 32G X 4 MM MISC
1.0000 | 0 refills | Status: DC
Start: 2020-03-10 — End: 2020-04-22

## 2020-03-10 NOTE — Telephone Encounter (Signed)
  Office: [phone]  /  Fax: [phone]  Date of Call: March 10, 2020  Time of Call: 9:10am Duration of Call: ~3 minutes Provider: Glennie Isle, PsyD  CONTENT: This provider called Scott Wolfe to check-in as he sent this provider a MyChart message. He noted plans to complete the book that was recommended by this provider and resume therapeutic services this fall due to his busy work schedule. Additionally, Scott Wolfe reported plans to continue meeting with Scott Wolfe. A brief risk assessment was completed. Scott Wolfe denied experiencing suicidal and homicidal ideation, plan, or intent since the last appointment with this provider.   PLAN: This provider will wait for Scott Wolfe to call back. No further follow-up planned by this provider.

## 2020-03-10 NOTE — Telephone Encounter (Signed)
fyi

## 2020-03-10 NOTE — Progress Notes (Signed)
Chief Complaint:   OBESITY Scott Wolfe is here to discuss his progress with his obesity treatment plan along with follow-up of his obesity related diagnoses. Scott Wolfe is on the Category 4 Plan and states he is following his eating plan approximately 75% of the time. Scott Wolfe states he is walking 4000 steps 6 times per week.  Today's visit was #: 7 Starting weight: 326 lbs Starting date: 11/03/2019 Today's weight: 288 lbs Today's date: 03/09/2020 Total lbs lost to date: 38 lbs Total lbs lost since last in-office visit: 0  Interim History: Scott Wolfe says he is still fatigued.  He has been reading about meditation/stress reduction.  He says he has been snacking too much at night and has been skipping lunch.  Subjective:   1. Essential hypertension Review: taking medications as instructed, no medication side effects noted, no chest pain on exertion, no dyspnea on exertion, no swelling of ankles.  Blood pressure is at goal.  BP Readings from Last 3 Encounters:  03/09/20 127/80  02/15/20 131/85  01/20/20 117/75   2. Type 2 diabetes mellitus with other specified complication, without long-term current use of insulin (HCC) Medications reviewed. Diabetic ROS: no polyuria or polydipsia, no chest pain, dyspnea or TIA's, no numbness, tingling or pain in extremities.   Lab Results  Component Value Date   HGBA1C 6.6 (H) 11/03/2019   Lab Results  Component Value Date   LDLCALC 17 01/20/2020   CREATININE 0.91 11/03/2019   Lab Results  Component Value Date   INSULIN 7.5 01/04/2020   INSULIN 125.0 (H) 11/03/2019   3. Other depression, with emotional eating Scott Wolfe is struggling with emotional eating and using food for comfort to the extent that it is negatively impacting his health. He has been working on behavior modification techniques to help reduce his emotional eating and has been somewhat successful. He shows no sign of suicidal or homicidal ideations.  4. At risk for heart disease Scott Wolfe is at a higher  than average risk for cardiovascular disease due to obesity.   Assessment/Plan:   1. Essential hypertension Scott Wolfe is working on healthy weight loss and exercise to improve blood pressure control. We will watch for signs of hypotension as he continues his lifestyle modifications.  2. Type 2 diabetes mellitus with other specified complication, without long-term current use of insulin (HCC) Good blood sugar control is important to decrease the likelihood of diabetic complications such as nephropathy, neuropathy, limb loss, blindness, coronary artery disease, and death. Intensive lifestyle modification including diet, exercise and weight loss are the first line of treatment for diabetes.   Orders - Semaglutide,0.25 or 0.5MG /DOS, (OZEMPIC, 0.25 OR 0.5 MG/DOSE,) 2 MG/1.5ML SOPN; Inject 0.375 mLs (0.5 mg total) into the skin once a week.  Dispense: 4 pen; Refill: 0  3. Other depression, with emotional eating Behavior modification techniques were discussed today to help Scott Wolfe deal with his emotional/non-hunger eating behaviors.  Orders and follow up as documented in patient record.   4. At risk for heart disease Scott Wolfe was given approximately 15 minutes of coronary artery disease prevention counseling today. He is 59 y.o. male and has risk factors for heart disease including obesity. We discussed intensive lifestyle modifications today with an emphasis on specific weight loss instructions and strategies.   Repetitive spaced learning was employed today to elicit superior memory formation and behavioral change.  5. Class 3 severe obesity with serious comorbidity and body mass index (BMI) of 40.0 to 44.9 in adult, unspecified obesity type (HCC) Scott Wolfe is  currently in the action stage of change. As such, his goal is to continue with weight loss efforts. He has agreed to the Category 3 Plan or the Category 4 Plan.   Exercise goals: For substantial health benefits, adults should do at least 150 minutes (2 hours  and 30 minutes) a week of moderate-intensity, or 75 minutes (1 hour and 15 minutes) a week of vigorous-intensity aerobic physical activity, or an equivalent combination of moderate- and vigorous-intensity aerobic activity. Aerobic activity should be performed in episodes of at least 10 minutes, and preferably, it should be spread throughout the week.  Behavioral modification strategies: increasing lean protein intake, ways to avoid boredom eating, ways to avoid night time snacking and emotional eating strategies.  Scott Wolfe has agreed to follow-up with our clinic in 4 weeks. He was informed of the importance of frequent follow-up visits to maximize his success with intensive lifestyle modifications for his multiple health conditions.   Objective:   Blood pressure 127/80, pulse 76, temperature 98.1 F (36.7 C), temperature source Oral, height 5\' 11"  (1.803 m), weight 288 lb (130.6 kg), SpO2 99 %. Body mass index is 40.17 kg/m.  General: Cooperative, alert, well developed, in no acute distress. HEENT: Conjunctivae and lids unremarkable. Cardiovascular: Regular rhythm.  Lungs: Normal work of breathing. Neurologic: No focal deficits.   Lab Results  Component Value Date   CREATININE 0.91 11/03/2019   BUN 14 11/03/2019   NA 141 11/03/2019   K 4.5 11/03/2019   CL 102 11/03/2019   CO2 24 11/03/2019   Lab Results  Component Value Date   ALT 54 (H) 01/20/2020   AST 34 01/20/2020   ALKPHOS 58 01/20/2020   BILITOT 1.0 01/20/2020   Lab Results  Component Value Date   HGBA1C 6.6 (H) 11/03/2019   Lab Results  Component Value Date   INSULIN 7.5 01/04/2020   INSULIN 125.0 (H) 11/03/2019   Lab Results  Component Value Date   TSH 1.910 11/03/2019   Lab Results  Component Value Date   CHOL 83 (L) 01/20/2020   HDL 49 01/20/2020   LDLCALC 17 01/20/2020   TRIG 87 01/20/2020   CHOLHDL 1.7 01/20/2020   Lab Results  Component Value Date   WBC 6.7 11/03/2019   HGB 17.4 11/03/2019   HCT  48.9 11/03/2019   MCV 97 11/03/2019   PLT 134 (L) 11/03/2019   Lab Results  Component Value Date   IRON 124 11/03/2019   TIBC 419 11/03/2019   FERRITIN 269 11/03/2019   Attestation Statements:   Reviewed by clinician on day of visit: allergies, medications, problem list, medical history, surgical history, family history, social history, and previous encounter notes.  I, Water quality scientist, CMA, am acting as transcriptionist for Briscoe Deutscher, DO  I have reviewed the above documentation for accuracy and completeness, and I agree with the above. Briscoe Deutscher, DO

## 2020-03-17 ENCOUNTER — Ambulatory Visit: Payer: Self-pay | Admitting: *Deleted

## 2020-03-18 ENCOUNTER — Ambulatory Visit: Payer: Self-pay | Admitting: *Deleted

## 2020-03-23 ENCOUNTER — Ambulatory Visit: Payer: Self-pay | Admitting: *Deleted

## 2020-03-29 ENCOUNTER — Ambulatory Visit: Payer: Self-pay | Admitting: *Deleted

## 2020-03-30 ENCOUNTER — Telehealth: Payer: Self-pay

## 2020-03-30 NOTE — Telephone Encounter (Signed)
Called and spoke w/pt stated that they are approved for the repatha as a renewal, pt voiced understanding

## 2020-04-04 ENCOUNTER — Encounter (INDEPENDENT_AMBULATORY_CARE_PROVIDER_SITE_OTHER): Payer: Self-pay | Admitting: Family Medicine

## 2020-04-04 ENCOUNTER — Ambulatory Visit (INDEPENDENT_AMBULATORY_CARE_PROVIDER_SITE_OTHER): Payer: 59 | Admitting: Family Medicine

## 2020-04-06 ENCOUNTER — Ambulatory Visit: Payer: Self-pay | Admitting: *Deleted

## 2020-04-07 ENCOUNTER — Other Ambulatory Visit (INDEPENDENT_AMBULATORY_CARE_PROVIDER_SITE_OTHER): Payer: Self-pay | Admitting: Family Medicine

## 2020-04-07 DIAGNOSIS — E1169 Type 2 diabetes mellitus with other specified complication: Secondary | ICD-10-CM

## 2020-04-08 ENCOUNTER — Other Ambulatory Visit (INDEPENDENT_AMBULATORY_CARE_PROVIDER_SITE_OTHER): Payer: Self-pay | Admitting: Family Medicine

## 2020-04-08 DIAGNOSIS — E1169 Type 2 diabetes mellitus with other specified complication: Secondary | ICD-10-CM

## 2020-04-08 DIAGNOSIS — G4709 Other insomnia: Secondary | ICD-10-CM

## 2020-04-12 ENCOUNTER — Other Ambulatory Visit: Payer: Self-pay

## 2020-04-12 ENCOUNTER — Ambulatory Visit (INDEPENDENT_AMBULATORY_CARE_PROVIDER_SITE_OTHER): Payer: 59 | Admitting: Adult Health

## 2020-04-12 ENCOUNTER — Encounter (INDEPENDENT_AMBULATORY_CARE_PROVIDER_SITE_OTHER): Payer: Self-pay | Admitting: Adult Health

## 2020-04-12 VITALS — BP 126/84 | HR 82 | Temp 97.8°F | Ht 71.0 in | Wt 285.6 lb

## 2020-04-12 DIAGNOSIS — E785 Hyperlipidemia, unspecified: Secondary | ICD-10-CM

## 2020-04-12 DIAGNOSIS — E1169 Type 2 diabetes mellitus with other specified complication: Secondary | ICD-10-CM | POA: Diagnosis not present

## 2020-04-12 DIAGNOSIS — Z6839 Body mass index (BMI) 39.0-39.9, adult: Secondary | ICD-10-CM

## 2020-04-12 DIAGNOSIS — E1159 Type 2 diabetes mellitus with other circulatory complications: Secondary | ICD-10-CM | POA: Diagnosis not present

## 2020-04-12 DIAGNOSIS — Z9189 Other specified personal risk factors, not elsewhere classified: Secondary | ICD-10-CM

## 2020-04-12 DIAGNOSIS — I10 Essential (primary) hypertension: Secondary | ICD-10-CM

## 2020-04-12 DIAGNOSIS — I152 Hypertension secondary to endocrine disorders: Secondary | ICD-10-CM

## 2020-04-12 MED ORDER — OZEMPIC (0.25 OR 0.5 MG/DOSE) 2 MG/1.5ML ~~LOC~~ SOPN: 0.5000 mg | PEN_INJECTOR | SUBCUTANEOUS | 0 refills | Status: DC

## 2020-04-12 NOTE — Progress Notes (Signed)
Chief Complaint:   OBESITY Scott Wolfe is here to discuss his progress with his obesity treatment plan along with follow-up of his obesity related diagnoses. Scott Wolfe is on the Category 4 Plan and states he is following his eating plan approximately 85% of the time. Scott Wolfe states he is exercising 0 minutes 0 times per week.  Today's visit was #: 8 Starting weight: 326 lbs Starting date: 11/03/2019 Today's weight: 285 lbs Today's date: 04/12/2020 Total lbs lost to date: 41 Total lbs lost since last in-office visit: 3  Interim History: Scott Wolfe continues to experience fatigue from his COVID-19 infection in March 2020. He has been unable to exercise due to the "long haul" effects of COVID-19. He denies excessive cravings or polyphagia on the Category 4 meal plan. His ultimate goal is to lose down to 250-260 lbs.  Subjective:   Hypertension associated with type 2 diabetes mellitus (Scott Wolfe). Blood pressure is at goal at today's office visit. Scott Wolfe is on lisinopril 20 mg daily and indapamide 1.25 mg daily. He recently stopped amlodipine 2.5 mg since blood pressure was lowering with steady weight loss.   BP Readings from Last 3 Encounters:  04/12/20 126/84  03/09/20 127/80  02/15/20 131/85   Lab Results  Component Value Date   CREATININE 0.91 11/03/2019   CREATININE 1.19 01/20/2017   CREATININE 1.12 09/09/2014   Type 2 diabetes mellitus with other specified complication, without long-term current use of insulin (Scott Wolfe). Scott Wolfe is on Semaglutide 0.5 mg once weekly injection and denies GI upset. Fasting blood glucose readings are ~110.  Lab Results  Component Value Date   HGBA1C 6.6 (H) 11/03/2019   Lab Results  Component Value Date   LDLCALC 17 01/20/2020   CREATININE 0.91 11/03/2019   Lab Results  Component Value Date   INSULIN 7.5 01/04/2020   INSULIN 125.0 (H) 11/03/2019   Hyperlipidemia associated with type 2 diabetes mellitus (Scott Wolfe). Scott Wolfe is on rosuvastatin 10 mg daily and Repatha  biweekly injection.   Lab Results  Component Value Date   CHOL 83 (L) 01/20/2020   HDL 49 01/20/2020   LDLCALC 17 01/20/2020   TRIG 87 01/20/2020   CHOLHDL 1.7 01/20/2020   Lab Results  Component Value Date   ALT 54 (H) 01/20/2020   AST 34 01/20/2020   ALKPHOS 58 01/20/2020   BILITOT 1.0 01/20/2020   The ASCVD Risk score (Scott Wolfe., et al., 2013) failed to calculate for the following reasons:   The valid total cholesterol range is 130 to 320 mg/dL  At risk for heart disease. Scott Wolfe is at a higher than average risk for cardiovascular disease due to hypertension, hyperlipidemia, type II diabetes mellitus, and obesity.   Assessment/Plan:   Hypertension associated with type 2 diabetes mellitus (Scott Wolfe). Scott Wolfe is working on healthy weight loss and exercise to improve blood pressure control. We will watch for signs of hypotension as he continues his lifestyle modifications.  Type 2 diabetes mellitus with other specified complication, without long-term current use of insulin (Scott Wolfe). Good blood sugar control is important to decrease the likelihood of diabetic complications such as nephropathy, neuropathy, limb loss, blindness, coronary artery disease, and death. Intensive lifestyle modification including diet, exercise and weight loss are the first line of treatment for diabetes. Refill was given for Semaglutide,0.25 or 0.5MG /DOS, (OZEMPIC, 0.25 OR 0.5 MG/DOSE,) 2 MG/1.5ML SOPN 0.5 mg once weekly injection #4 pens with 0 refills. He was asked to bring recent labs from his PCP Scott Wolfe).  Hyperlipidemia associated with  type 2 diabetes mellitus (Scott Wolfe). Cardiovascular risk and specific lipid/LDL goals reviewed.  We discussed several lifestyle modifications today and Scott Wolfe will continue to work on diet, exercise and weight loss efforts. Orders and follow up as documented in patient record. Scott Wolfe was advised to continue to limit saturated fat intake and will continue his current therapy as directed.    Counseling Intensive lifestyle modifications are the first line treatment for this issue. . Dietary changes: Increase soluble fiber. Decrease simple carbohydrates. . Exercise changes: Moderate to vigorous-intensity aerobic activity 150 minutes per week if tolerated.  . Lipid-lowering medications: see documented in medical record.  At risk for heart disease. Scott Wolfe was given approximately 15 minutes of coronary artery disease prevention counseling today. He is 59 y.o. male and has risk factors for heart disease including obesity. We discussed intensive lifestyle modifications today with an emphasis on specific weight loss instructions and strategies.   Repetitive spaced learning was employed today to elicit superior memory formation and behavioral change.  Class 2 severe obesity with serious comorbidity and body mass index (BMI) of 39.0 to 39.9 in adult, unspecified obesity type (Scott Wolfe).  Scott Wolfe is currently in the action stage of change. As such, his goal is to continue with weight loss efforts. He has agreed to the Category 4 Plan.   Exercise goals: No exercise has been prescribed at this time.  Behavioral modification strategies: increasing lean protein intake, meal planning and cooking strategies and planning for success.  Scott Wolfe has agreed to follow-up with our clinic in 2 weeks. He was informed of the importance of frequent follow-up visits to maximize his success with intensive lifestyle modifications for his multiple health conditions.   Objective:   Blood pressure 126/84, pulse 82, temperature 97.8 F (36.6 C), temperature source Oral, height 5\' 11"  (1.803 m), weight 285 lb 9.6 oz (129.5 kg), SpO2 99 %. Body mass index is 39.83 kg/m.  General: Cooperative, alert, well developed, in no acute distress. HEENT: Conjunctivae and lids unremarkable. Cardiovascular: Regular rhythm.  Lungs: Normal work of breathing. Neurologic: No focal deficits.   Lab Results  Component Value Date    CREATININE 0.91 11/03/2019   BUN 14 11/03/2019   NA 141 11/03/2019   K 4.5 11/03/2019   CL 102 11/03/2019   CO2 24 11/03/2019   Lab Results  Component Value Date   ALT 54 (H) 01/20/2020   AST 34 01/20/2020   ALKPHOS 58 01/20/2020   BILITOT 1.0 01/20/2020   Lab Results  Component Value Date   HGBA1C 6.6 (H) 11/03/2019   Lab Results  Component Value Date   INSULIN 7.5 01/04/2020   INSULIN 125.0 (H) 11/03/2019   Lab Results  Component Value Date   TSH 1.910 11/03/2019   Lab Results  Component Value Date   CHOL 83 (L) 01/20/2020   HDL 49 01/20/2020   LDLCALC 17 01/20/2020   TRIG 87 01/20/2020   CHOLHDL 1.7 01/20/2020   Lab Results  Component Value Date   WBC 6.7 11/03/2019   HGB 17.4 11/03/2019   HCT 48.9 11/03/2019   MCV 97 11/03/2019   PLT 134 (L) 11/03/2019   Lab Results  Component Value Date   IRON 124 11/03/2019   TIBC 419 11/03/2019   FERRITIN 269 11/03/2019   Attestation Statements:   Reviewed by clinician on day of visit: allergies, medications, problem list, medical history, surgical history, family history, social history, and previous encounter notes.  IMichaelene Song, am acting as Location manager for [employer], NP-C  I have reviewed the above documentation for accuracy and completeness, and I agree with the above. -  Esaw Grandchild, NP

## 2020-04-13 DIAGNOSIS — E1159 Type 2 diabetes mellitus with other circulatory complications: Secondary | ICD-10-CM | POA: Insufficient documentation

## 2020-04-18 ENCOUNTER — Ambulatory Visit: Payer: Self-pay | Admitting: *Deleted

## 2020-04-20 ENCOUNTER — Encounter (INDEPENDENT_AMBULATORY_CARE_PROVIDER_SITE_OTHER): Payer: Self-pay

## 2020-04-21 ENCOUNTER — Other Ambulatory Visit: Payer: Self-pay | Admitting: *Deleted

## 2020-04-21 NOTE — Patient Outreach (Signed)
[facility]) Care Management  04/21/2020  Scott Wolfe 01/21/1961 [id number]  Telephone Screening Received patient as reassignment of Care Coordinator on 04/21/20  Referral received: 01/13/20 Referral source: Royann Shivers,  Referral reason : [employer] member for program engagement .     Outreach attempt #4 Subjective: Outreach call to patient,he immediately request return call later today, as he is about to start a meeting at work. Request return call after 3 pm.   1530 Return call to patient, explained [facility] care management and reason for the call.  Patient states that he is feeling pretty good on today, just a lot going on at work.    Social  Patient independent with care, working daily with city [place].   Conditions  He discussed having history of hypertension, reports that blood pressure is well controlled and had made adjustment to decrease medications. He reports that his blood pressure readings have been at goal less than 140/80. He discussed being followed by [facility]  healthy weight management and has lost 40 lbs over the last 6 months. He discussed having he reports preDiabetes A1c 6.6, he monitors readings at home with reading in less than 120.  Patient discussed having covid 19 earlier in the year and still having some lingering issues not able to exercise as he should , has appointment with Pulmonary MD on tomorrow.   Medications He denies medication cost concerns and taking medications as prescribed.   Consent  Explained and offered [facility] care management Disease management program for support in self health management of medical conditions working with his Medical providers.  Patient states that is managing very well with his health , states that he follows up with Healthy weight management biweekly and is getting better control of health and pleased with progress . Explained that RN health coach can follow telephonically  quarterly  as determined. Patient declines further follow up or calls  at this time stating he is very busy with work ,however he is agreeable to receiving [facility]  information letter in case change in decision.    Plan Will plan case closure.    Joylene Draft, RN, BSN  [place] Management Coordinator  [phone]- Mobile [phone]- Toll Free Main Office

## 2020-04-22 ENCOUNTER — Encounter (INDEPENDENT_AMBULATORY_CARE_PROVIDER_SITE_OTHER): Payer: Self-pay | Admitting: Adult Health

## 2020-04-22 ENCOUNTER — Encounter: Payer: Self-pay | Admitting: Pulmonary Disease

## 2020-04-22 ENCOUNTER — Ambulatory Visit: Payer: 59 | Admitting: Pulmonary Disease

## 2020-04-22 ENCOUNTER — Other Ambulatory Visit: Payer: Self-pay

## 2020-04-22 VITALS — BP 130/90 | HR 86 | Ht 71.0 in | Wt 289.5 lb

## 2020-04-22 DIAGNOSIS — R0600 Dyspnea, unspecified: Secondary | ICD-10-CM

## 2020-04-22 DIAGNOSIS — R0609 Other forms of dyspnea: Secondary | ICD-10-CM

## 2020-04-22 MED ORDER — FLUTICASONE-SALMETEROL 115-21 MCG/ACT IN AERO: 2.0000 | INHALATION_SPRAY | Freq: Two times a day (BID) | RESPIRATORY_TRACT | 12 refills | Status: DC

## 2020-04-22 NOTE — Patient Instructions (Signed)
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  Certainly, we will know more in the coming months when we get your breathing tests and we repeat that CT scan.  I think is possible given your history of allergy symptoms that you have developed asthma-like condition triggered by the viral infection.  Use Advair 2 puffs twice a day.  Rinse mouth out with water after every use.  Hopefully, this will help improve your exercise tolerance and review the ability to push yourself more when you are walking etc.  See me a message in a month or so and let me know if it is helping at all.  If helping some but not totally satisfied, we can increase the dose.  Come back in 3 months with breathing tests.

## 2020-04-22 NOTE — Progress Notes (Signed)
Patient ID: Scott Wolfe, male    DOB: Jul 09, 1961, 59 y.o.   MRN: [id number]  Chief Complaint  Patient presents with  . Consult    Referred by PCP Dr. London Pepper for SOB after COVID. Was diagnosed with COVID back in March 2020. Notices the SOB with exertion.     Referring provider: London Pepper, MD  HPI:   No significant dyspnea since Covid infection March 2021.  Abnormal walk several miles on relatively flat surfaces prior to infection.  However, since he has difficulty with exercising.  At this time, struggles to push past about a mile or so.  Had to lift up to 2 and half miles or so a month or so ago but now back down to a mile.  Notes he has lost about 40 pounds since March.  This is done primarily with improved diet.  He finds it frustrating to not increase his exercise and efforts to continue to lose additional weight.  He has a history of "hay fever" that occurs in the fall and spring.  He takes Zyrtec seasonally for symptoms of nasal congestion and watery nose.  Notes in the past has had significant sinus pressure.  Denies any sinus surgery.  He has had OSA for about 15 years.  He has an auto titrating CPAP at home.  He reports no issues with it.  His reports every day are good.  He is unsure if it displays an AHI.  Review of prior imaging includes scalp films from CT scans 08/24/2019 and 03/08/2020 showed clear lungs and elevated right hemidiaphragm on my interpretation.  CT chest 03/2020 with clear lungs bilaterally elevated right hemidiaphragm, scattered linear opacities in bilateral bases favored to represent atelectasis.  PMH: OSA, hypertension, hyperlipidemia Surgical history: Knee surgery Family history: History of asthma and allergies Social history: Lives in [place], Freight forwarder of water treatment plants, rare alcohol use, does not smoke, looking forward to retiring in the next couple years and going back to [employer] / Pulmonary Flowsheets:   ACT:  No  flowsheet data found.  MMRC: No flowsheet data found.  Epworth:  No flowsheet data found.  Tests:   FENO:  No results found for: NITRICOXIDE  PFT: No flowsheet data found.  WALK:  No flowsheet data found.  Imaging: Personally reviewed and as per EMR discussion of this note  Lab Results: Personally reviewed and as per EMR CBC    Component Value Date/Time   WBC 6.7 11/03/2019 1534   WBC 14.7 (H) 01/20/2017 1148   RBC 5.02 11/03/2019 1534   RBC 5.07 01/20/2017 1148   HGB 17.4 11/03/2019 1534   HCT 48.9 11/03/2019 1534   PLT 134 (L) 11/03/2019 1534   MCV 97 11/03/2019 1534   MCH 34.7 (H) 11/03/2019 1534   MCH 34.3 (H) 01/20/2017 1148   MCHC 35.6 11/03/2019 1534   MCHC 36.3 (H) 01/20/2017 1148   RDW 13.0 11/03/2019 1534   LYMPHSABS 1.7 11/03/2019 1534   MONOABS 0.8 01/20/2017 1148   EOSABS 0.2 11/03/2019 1534   BASOSABS 0.1 11/03/2019 1534    BMET    Component Value Date/Time   NA 141 11/03/2019 1534   K 4.5 11/03/2019 1534   CL 102 11/03/2019 1534   CO2 24 11/03/2019 1534   GLUCOSE 130 (H) 11/03/2019 1534   GLUCOSE 125 (H) 01/20/2017 1148   BUN 14 11/03/2019 1534   CREATININE 0.91 11/03/2019 1534   CALCIUM 9.9 11/03/2019 1534   GFRNONAA 92 11/03/2019 1534  GFRAA 106 11/03/2019 1534    BNP No results found for: BNP  ProBNP No results found for: PROBNP  Specialty Problems      Pulmonary Problems   Dyspnea      No Known Allergies   There is no immunization history on file for this patient.  Past Medical History:  Diagnosis Date  . ADHD (attention deficit hyperactivity disorder)   . Borderline diabetes   . COVID-19 virus infection    January 2021, contd sxs, Taste/smell, fogginess,headache, nausea,tremor,   . Dyspnea   . Elevated liver enzymes   . Hypercholesterolemia   . Hypertension   . Kidney stone   . Lower extremity edema   . Obesity   . OCD (obsessive compulsive disorder)   . Prediabetes   . Renal disorder   . Sleep apnea     uses CPAP nightly    Tobacco History: Social History   Tobacco Use  Smoking Status Never Smoker  Smokeless Tobacco Never Used   Counseling given: Not Answered    Outpatient Encounter Medications as of 04/22/2020  Medication Sig  . aspirin 81 MG tablet Take 1 tablet (81 mg total) by mouth daily.  . cetirizine (ZYRTEC) 10 MG tablet Take 10 mg by mouth daily as needed for allergies.  . Evolocumab (REPATHA SURECLICK) 259 MG/ML SOAJ Inject 1 pen into the skin every 14 (fourteen) days.  . Glucosamine-Chondroitin (OSTEO BI-FLEX REGULAR STRENGTH PO) Take 1 tablet by mouth daily.  . indapamide (LOZOL) 1.25 MG tablet Take 1.25 mg by mouth daily.  Marland Kitchen lisdexamfetamine (VYVANSE) 60 MG capsule Take 60 mg by mouth every morning.  Marland Kitchen lisinopril (ZESTRIL) 20 MG tablet Take 20 mg by mouth daily.   . Multiple Vitamin (MULTIVITAMIN WITH MINERALS) TABS tablet Take 1 tablet by mouth daily.  . Semaglutide,0.25 or 0.5MG /DOS, (OZEMPIC, 0.25 OR 0.5 MG/DOSE,) 2 MG/1.5ML SOPN Inject 0.375 mLs (0.5 mg total) into the skin once a week.  . traZODone (DESYREL) 50 MG tablet Take 1 tablet (50 mg total) by mouth at bedtime as needed for sleep.  . fluticasone-salmeterol (ADVAIR HFA) 115-21 MCG/ACT inhaler Inhale 2 puffs into the lungs 2 (two) times daily.  . rosuvastatin (CRESTOR) 10 MG tablet Take 1 tablet (10 mg total) by mouth daily.  . [DISCONTINUED] Insulin Pen Needle 32G X 4 MM MISC 1 each by Does not apply route once a week.   No facility-administered encounter medications on file as of 04/22/2020.     Review of Systems  Review of Systems  No exertional chest pain.  No orthopnea or PND.  Wakes up several times a night urinating.  Increased aches and pains in most joints.  Comfortably review of systems otherwise negative.  Physical Exam  BP 130/90   Pulse 86   Ht 5\' 11"  (1.803 m)   Wt 289 lb 8 oz (131.3 kg)   SpO2 99% Comment: on RA  BMI 40.38 kg/m   Wt Readings from Last 5 Encounters:  04/22/20  289 lb 8 oz (131.3 kg)  04/12/20 285 lb 9.6 oz (129.5 kg)  03/09/20 288 lb (130.6 kg)  02/15/20 288 lb (130.6 kg)  01/20/20 291 lb (132 kg)    BMI Readings from Last 5 Encounters:  04/22/20 40.38 kg/m  04/12/20 39.83 kg/m  03/09/20 40.17 kg/m  02/15/20 40.17 kg/m  01/20/20 40.59 kg/m     Physical Exam General: Well-appearing, no acute distress Eyes: EOMI, no icterus Neck: No JVP appreciated sitting upright, supple Respiratory: Normal work of breathing,  on room air, no wheeze Cardiovascular: Regular rhythm, no murmurs Abdomen: Soft, bowel sounds present MSK: No synovitis, no joint effusion Neuro: Normal gait, no weakness Psych: Normal mood, full affect   Assessment & Plan:   Scott Wolfe is a 58 year old man with hyperlipidemia, coronary disease whom are seen in consultation at the request of London Pepper, MD for evaluation of dyspnea on exertion.  Dyspnea on exertion: Significant dyspnea after COVID-19 infection in March 2021.  Was laid up in bed and inactive for about 3 weeks.  Has trouble getting back to his prior exercise tolerance since.  Suspect multifactorial in some way related to deconditioning.  However he is finding hard to push through and increase his exercise tolerance despite an eagerness to do so.  He has history of atopic symptoms query whether he developed asthma in the setting of viral infection.  Trial ICS/LABA with mid dose Advair 2 puffs twice daily.  Will obtain PFTs at follow-up visit.  Lung nodule: Noted on cardiac CT 12/20 with stable appearance 7/21.  Due for repeat in 12 to 18 months per Fleischner criteria.  We will move forward with imaging when the time is appropriate.   Return in about 3 months (around 07/23/2020).   Lanier Clam, MD 04/22/2020

## 2020-04-25 NOTE — Telephone Encounter (Signed)
Please review.  Let me know if you need me to print these out for you.  Thanks

## 2020-04-26 ENCOUNTER — Other Ambulatory Visit: Payer: Self-pay

## 2020-04-26 ENCOUNTER — Encounter (INDEPENDENT_AMBULATORY_CARE_PROVIDER_SITE_OTHER): Payer: Self-pay | Admitting: Adult Health

## 2020-04-26 ENCOUNTER — Ambulatory Visit (INDEPENDENT_AMBULATORY_CARE_PROVIDER_SITE_OTHER): Payer: 59 | Admitting: Adult Health

## 2020-04-26 VITALS — BP 130/79 | HR 77 | Temp 98.2°F | Ht 71.0 in | Wt 284.0 lb

## 2020-04-26 DIAGNOSIS — E1169 Type 2 diabetes mellitus with other specified complication: Secondary | ICD-10-CM

## 2020-04-26 DIAGNOSIS — Z6839 Body mass index (BMI) 39.0-39.9, adult: Secondary | ICD-10-CM

## 2020-04-26 DIAGNOSIS — E1159 Type 2 diabetes mellitus with other circulatory complications: Secondary | ICD-10-CM

## 2020-04-26 DIAGNOSIS — I10 Essential (primary) hypertension: Secondary | ICD-10-CM

## 2020-04-26 DIAGNOSIS — Z9189 Other specified personal risk factors, not elsewhere classified: Secondary | ICD-10-CM | POA: Diagnosis not present

## 2020-04-26 MED ORDER — OZEMPIC (0.25 OR 0.5 MG/DOSE) 2 MG/1.5ML ~~LOC~~ SOPN: 0.5000 mg | PEN_INJECTOR | SUBCUTANEOUS | 0 refills | Status: DC

## 2020-04-26 NOTE — Progress Notes (Signed)
Chief Complaint:   OBESITY Scott Wolfe is here to discuss his progress with his obesity treatment plan along with follow-up of his obesity related diagnoses. Scott Wolfe is on the Category 4 Plan and states he is following his eating plan approximately 75% of the time. Scott Wolfe states he is walking 30 minutes 6 times per week.  Today's visit was #: 9 Starting weight: 326 lbs Starting date: 11/03/2019 Today's weight: 284 lbs Today's date: 04/26/2020 Total lbs lost to date: 42 Total lbs lost since last in-office visit: 1  Interim History: Scott Wolfe was recently seen by Pulmonology and started on Advair and will follow-up in 3 months. He continues to enjoy the foods and structure of the Category 4 meal plan and denies polyphagia or excessive cravings. His ultimate goal is to lose down to ~235 lbs.  Subjective:   Type 2 diabetes mellitus with other specified complication, without long-term current use of insulin ([place]).  Scott Wolfe is on Semaglutide 0.5 mg once weekly and denies GI upset. He denies swallowing difficulties or developing hoarseness. He denies family history of MTC.  Lab Results  Component Value Date   HGBA1C 6.6 (H) 11/03/2019   Lab Results  Component Value Date   LDLCALC 17 01/20/2020   CREATININE 0.91 11/03/2019   Lab Results  Component Value Date   INSULIN 7.5 01/04/2020   INSULIN 125.0 (H) 11/03/2019   Hypertension associated with type 2 diabetes mellitus ([place]). Blood pressure is at goal on today's office visit. Scott Wolfe denies tobacco/vape use. He is on lisinopril 20 mg daily.  BP Readings from Last 3 Encounters:  04/26/20 130/79  04/22/20 130/90  04/12/20 126/84   Lab Results  Component Value Date   CREATININE 0.91 11/03/2019   CREATININE 1.19 01/20/2017   CREATININE 1.12 09/09/2014   At risk for activity intolerance. Scott Wolfe is at risk of exercise intolerance due to post-COVID infection complications - recently started on Advair by pulmonologist.  Assessment/Plan:    Type 2 diabetes mellitus with other specified complication, without long-term current use of insulin ([place]). Good blood sugar control is important to decrease the likelihood of diabetic complications such as nephropathy, neuropathy, limb loss, blindness, coronary artery disease, and death. Intensive lifestyle modification including diet, exercise and weight loss are the first line of treatment for diabetes. Prescription was given for Semaglutide,0.25 or 0.5MG /DOS, (OZEMPIC, 0.25 OR 0.5 MG/DOSE,) 2 MG/1.5ML SOPN 0.5 mg once weekly #2 pens with 0 refills. [place] will continue to follow the Category 4 meal plan.  Hypertension associated with type 2 diabetes mellitus ([place]). Scott Wolfe is working on healthy weight loss and exercise to improve blood pressure control. We will watch for signs of hypotension as he continues his lifestyle modifications. Scott Wolfe will continue ACE as directed and will continue to follow the Category 4 meal plan.  At risk for activity intolerance. Scott Wolfe was given approximately 15 minutes of exercise intolerance counseling today. He is 59 y.o. male and has risk factors exercise intolerance including obesity. We discussed intensive lifestyle modifications today with an emphasis on specific weight loss instructions and strategies. Scott Wolfe will slowly increase activity as tolerated.  Repetitive spaced learning was employed today to elicit superior memory formation and behavioral change.  Class 2 severe obesity with serious comorbidity and body mass index (BMI) of 39.0 to 39.9 in adult, unspecified obesity type ([place]).  Scott Wolfe is currently in the action stage of change. As such, his goal is to continue with weight loss efforts. He has agreed to the Category 4  Plan.   Exercise goals: Scott Wolfe will continue his current exercise regimen.   Behavioral modification strategies: increasing lean protein intake, meal planning and cooking strategies and planning for success.  Scott Wolfe has agreed to follow-up with our  clinic fasting in 3 weeks. He was informed of the importance of frequent follow-up visits to maximize his success with intensive lifestyle modifications for his multiple health conditions.   Objective:   Blood pressure 130/79, pulse 77, temperature 98.2 F (36.8 C), height 5\' 11"  (1.803 m), SpO2 97 %. Body mass index is 40.38 kg/m.  General: Cooperative, alert, well developed, in no acute distress. HEENT: Conjunctivae and lids unremarkable. Cardiovascular: Regular rhythm.  Lungs: Normal work of breathing. Neurologic: No focal deficits.   Lab Results  Component Value Date   CREATININE 0.91 11/03/2019   BUN 14 11/03/2019   NA 141 11/03/2019   K 4.5 11/03/2019   CL 102 11/03/2019   CO2 24 11/03/2019   Lab Results  Component Value Date   ALT 54 (H) 01/20/2020   AST 34 01/20/2020   ALKPHOS 58 01/20/2020   BILITOT 1.0 01/20/2020   Lab Results  Component Value Date   HGBA1C 6.6 (H) 11/03/2019   Lab Results  Component Value Date   INSULIN 7.5 01/04/2020   INSULIN 125.0 (H) 11/03/2019   Lab Results  Component Value Date   TSH 1.910 11/03/2019   Lab Results  Component Value Date   CHOL 83 (L) 01/20/2020   HDL 49 01/20/2020   LDLCALC 17 01/20/2020   TRIG 87 01/20/2020   CHOLHDL 1.7 01/20/2020   Lab Results  Component Value Date   WBC 6.7 11/03/2019   HGB 17.4 11/03/2019   HCT 48.9 11/03/2019   MCV 97 11/03/2019   PLT 134 (L) 11/03/2019   Lab Results  Component Value Date   IRON 124 11/03/2019   TIBC 419 11/03/2019   FERRITIN 269 11/03/2019   Attestation Statements:   Reviewed by clinician on day of visit: allergies, medications, problem list, medical history, surgical history, family history, social history, and previous encounter notes.  I, Michaelene Song, am acting as Location manager for [employer], NP-C   I have reviewed the above documentation for accuracy and completeness, and I agree with the above. -  Esaw Grandchild, NP

## 2020-05-05 ENCOUNTER — Other Ambulatory Visit (INDEPENDENT_AMBULATORY_CARE_PROVIDER_SITE_OTHER): Payer: Self-pay | Admitting: Adult Health

## 2020-05-05 ENCOUNTER — Other Ambulatory Visit (INDEPENDENT_AMBULATORY_CARE_PROVIDER_SITE_OTHER): Payer: Self-pay | Admitting: Family Medicine

## 2020-05-05 DIAGNOSIS — G4709 Other insomnia: Secondary | ICD-10-CM

## 2020-05-05 MED ORDER — TRAZODONE HCL 50 MG PO TABS: 50.0000 mg | ORAL_TABLET | Freq: Every evening | ORAL | 0 refills | Status: DC | PRN

## 2020-05-18 ENCOUNTER — Ambulatory Visit (INDEPENDENT_AMBULATORY_CARE_PROVIDER_SITE_OTHER): Payer: 59 | Admitting: Adult Health

## 2020-05-30 ENCOUNTER — Encounter (INDEPENDENT_AMBULATORY_CARE_PROVIDER_SITE_OTHER): Payer: Self-pay | Admitting: Family Medicine

## 2020-05-30 ENCOUNTER — Other Ambulatory Visit: Payer: Self-pay

## 2020-05-30 ENCOUNTER — Ambulatory Visit (INDEPENDENT_AMBULATORY_CARE_PROVIDER_SITE_OTHER): Payer: 59 | Admitting: Family Medicine

## 2020-05-30 VITALS — BP 121/71 | HR 74 | Temp 97.8°F | Ht 71.0 in | Wt 277.0 lb

## 2020-05-30 DIAGNOSIS — Z9189 Other specified personal risk factors, not elsewhere classified: Secondary | ICD-10-CM | POA: Diagnosis not present

## 2020-05-30 DIAGNOSIS — K760 Fatty (change of) liver, not elsewhere classified: Secondary | ICD-10-CM | POA: Diagnosis not present

## 2020-05-30 DIAGNOSIS — E559 Vitamin D deficiency, unspecified: Secondary | ICD-10-CM | POA: Diagnosis not present

## 2020-05-30 DIAGNOSIS — E1169 Type 2 diabetes mellitus with other specified complication: Secondary | ICD-10-CM | POA: Diagnosis not present

## 2020-05-30 DIAGNOSIS — Z9989 Dependence on other enabling machines and devices: Secondary | ICD-10-CM

## 2020-05-30 DIAGNOSIS — E785 Hyperlipidemia, unspecified: Secondary | ICD-10-CM

## 2020-05-30 DIAGNOSIS — I152 Hypertension secondary to endocrine disorders: Secondary | ICD-10-CM

## 2020-05-30 DIAGNOSIS — R5383 Other fatigue: Secondary | ICD-10-CM

## 2020-05-30 DIAGNOSIS — F3289 Other specified depressive episodes: Secondary | ICD-10-CM

## 2020-05-30 DIAGNOSIS — G4733 Obstructive sleep apnea (adult) (pediatric): Secondary | ICD-10-CM

## 2020-05-30 DIAGNOSIS — E1159 Type 2 diabetes mellitus with other circulatory complications: Secondary | ICD-10-CM | POA: Diagnosis not present

## 2020-05-30 DIAGNOSIS — Z6838 Body mass index (BMI) 38.0-38.9, adult: Secondary | ICD-10-CM

## 2020-05-30 DIAGNOSIS — I10 Essential (primary) hypertension: Secondary | ICD-10-CM

## 2020-05-31 LAB — CBC WITH DIFFERENTIAL/PLATELET
Basophils Absolute: 0.1 10*3/uL (ref 0.0–0.2)
Basos: 1 %
EOS (ABSOLUTE): 0.1 10*3/uL (ref 0.0–0.4)
Eos: 2 %
Hematocrit: 49.6 % (ref 37.5–51.0)
Hemoglobin: 17 g/dL (ref 13.0–17.7)
Immature Grans (Abs): 0 10*3/uL (ref 0.0–0.1)
Immature Granulocytes: 0 %
Lymphocytes Absolute: 1.4 10*3/uL (ref 0.7–3.1)
Lymphs: 24 %
MCH: 32.8 pg (ref 26.6–33.0)
MCHC: 34.3 g/dL (ref 31.5–35.7)
MCV: 96 fL (ref 79–97)
Monocytes Absolute: 0.5 10*3/uL (ref 0.1–0.9)
Monocytes: 9 %
Neutrophils Absolute: 3.7 10*3/uL (ref 1.4–7.0)
Neutrophils: 64 %
Platelets: 106 10*3/uL — ABNORMAL LOW (ref 150–450)
RBC: 5.18 x10E6/uL (ref 4.14–5.80)
RDW: 12.5 % (ref 11.6–15.4)
WBC: 5.7 10*3/uL (ref 3.4–10.8)

## 2020-05-31 LAB — COMPREHENSIVE METABOLIC PANEL
ALT: 46 IU/L — ABNORMAL HIGH (ref 0–44)
AST: 37 IU/L (ref 0–40)
Albumin/Globulin Ratio: 2.5 — ABNORMAL HIGH (ref 1.2–2.2)
Albumin: 4.8 g/dL (ref 3.8–4.9)
Alkaline Phosphatase: 52 IU/L (ref 44–121)
BUN/Creatinine Ratio: 21 (ref 10–24)
BUN: 25 mg/dL (ref 8–27)
Bilirubin Total: 1.2 mg/dL (ref 0.0–1.2)
CO2: 24 mmol/L (ref 20–29)
Calcium: 9.5 mg/dL (ref 8.6–10.2)
Chloride: 105 mmol/L (ref 96–106)
Creatinine, Ser: 1.17 mg/dL (ref 0.76–1.27)
GFR calc Af Amer: 78 mL/min/{1.73_m2} (ref >59)
GFR calc non Af Amer: 67 mL/min/{1.73_m2} (ref >59)
Globulin, Total: 1.9 g/dL (ref 1.5–4.5)
Glucose: 92 mg/dL (ref 65–99)
Potassium: 4.9 mmol/L (ref 3.5–5.2)
Sodium: 143 mmol/L (ref 134–144)
Total Protein: 6.7 g/dL (ref 6.0–8.5)

## 2020-05-31 LAB — VITAMIN B12: Vitamin B-12: 1155 pg/mL (ref 232–1245)

## 2020-05-31 LAB — LIPID PANEL
Chol/HDL Ratio: 1.8 ratio (ref 0.0–5.0)
Cholesterol, Total: 100 mg/dL (ref 100–199)
HDL: 55 mg/dL (ref >39)
LDL Chol Calc (NIH): 29 mg/dL (ref 0–99)
Triglycerides: 75 mg/dL (ref 0–149)
VLDL Cholesterol Cal: 16 mg/dL (ref 5–40)

## 2020-05-31 LAB — HEMOGLOBIN A1C
Est. average glucose Bld gHb Est-mCnc: 105 mg/dL
Hgb A1c MFr Bld: 5.3 % (ref 4.8–5.6)

## 2020-05-31 LAB — VITAMIN D 25 HYDROXY (VIT D DEFICIENCY, FRACTURES): Vit D, 25-Hydroxy: 51.3 ng/mL (ref 30.0–100.0)

## 2020-06-01 ENCOUNTER — Other Ambulatory Visit (INDEPENDENT_AMBULATORY_CARE_PROVIDER_SITE_OTHER): Payer: Self-pay | Admitting: Adult Health

## 2020-06-01 DIAGNOSIS — G4709 Other insomnia: Secondary | ICD-10-CM

## 2020-06-01 NOTE — Progress Notes (Signed)
Chief Complaint:   OBESITY Scott Wolfe is here to discuss his progress with his obesity treatment plan along with follow-up of his obesity related diagnoses. Scott Wolfe is on the Category 4 Plan and states he is following his eating plan approximately 80% of the time. Scott Wolfe states he has increased his walking.  Today's visit was #: 10 Starting weight: 326 lbs Starting date: 11/03/2019 Today's weight: 277 lbs Today's date: 05/30/2020 Total lbs lost to date: 49 lbs Total lbs lost since last in-office visit: 7 lbs Total weight loss percentage to date: -15.03%  Interim History: Scott Wolfe is down 49 pounds.  He says he is happy with the plan.  He would like to start aerobic exercise.  Assessment/Plan:   1. Type 2 diabetes mellitus with other specified complication, without long-term current use of insulin (Scott Wolfe) Diabetes Mellitus: reasonably well controlled. Medication: Ozempic. Issues reviewed with him: blood sugar goals, complications of diabetes mellitus, hypoglycemia prevention and treatment, exercise, nutrition, and carbohydrate counting.   Lab Results  Component Value Date   HGBA1C 5.3 05/30/2020   HGBA1C 6.6 (H) 11/03/2019   Lab Results  Component Value Date   LDLCALC 29 05/30/2020   CREATININE 1.17 05/30/2020   - Comprehensive metabolic panel - CBC with Differential/Platelet - Hemoglobin A1c  2. Hypertension associated with type 2 diabetes mellitus ([place]) At goal. Medications: lisinopril, indapamide. We will monitor for hypotension with continued weight loss.   BP Readings from Last 3 Encounters:  05/30/20 121/71  04/26/20 130/79  04/22/20 130/90   Lab Results  Component Value Date   CREATININE 1.17 05/30/2020   3. Hyperlipidemia associated with type 2 diabetes mellitus ([place]) Well controlled on current regimen. LDL is at goal on current statin dose, patient has no side effects from medication and LFTS are normal. No changes today.  Lab Results  Component Value Date   ALT 46 (H)  05/30/2020   AST 37 05/30/2020   ALKPHOS 52 05/30/2020   BILITOT 1.2 05/30/2020   Lab Results  Component Value Date   CHOL 100 05/30/2020   HDL 55 05/30/2020   LDLCALC 29 05/30/2020   TRIG 75 05/30/2020   CHOLHDL 1.8 05/30/2020   - Lipid panel  4. Vitamin D deficiency Current vitamin D is 58.1, tested on 01/04/2020. Not at goal. Optimal goal > 50 ng/dL. There is also evidence to support a goal of >70 ng/dL in patients with cancer and heart disease. Plan: Continue Vitamin D @50 ,000 IU every week with follow-up for routine testing of Vitamin D at least 2-3 times per year to avoid over-replacement.  - VITAMIN D 25 Hydroxy (Vit-D Deficiency, Fractures)  5. NAFLD (nonalcoholic fatty liver disease) Treatment goal: 7-10% reduction of body weight.  Aerobic and resistance physical activity are important to help achieve a healthy body weight BUT also increases peripheral insulin sensitivity, reducing delivery of free fatty acids and glucose to the liver. Exercise also increases intrahepatic fatty acid oxidation, decreasing fatty acid synthesis, and helps prevent mitochondrial and hepatocellular damage. Medications that can help reduce hepatic fat include metformin and GLP1RAs.   6. OSA on CPAP Scott Wolfe has a diagnosis of sleep apnea. He reports that he is using a CPAP regularly.   7. Other fatigue Scott Wolfe endorses more fatigue than usual.  Will check vitamin B12 level today.  - Vitamin B12  8. Other depression, with emotional eating Behavior modification techniques were discussed today to help Scott Wolfe deal with his emotional/non-hunger eating behaviors.  Orders and follow up as documented  in patient record.    9. At risk for heart disease Scott Wolfe was given approximately 15 minutes of coronary artery disease prevention counseling today. He is 59 y.o. male and has risk factors for heart disease including obesity and diabetes. We again discussed intensive lifestyle modifications today with an emphasis on  specific weight loss instructions and strategies.   10. Class 2 severe obesity with serious comorbidity and body mass index (BMI) of 38.0 to 38.9 in adult, unspecified obesity type ([facility])  Scott Wolfe is currently in the action stage of change. As such, his goal is to continue with weight loss efforts. He has agreed to the Category 4 Plan.   Exercise goals: For substantial health benefits, adults should do at least 150 minutes (2 hours and 30 minutes) a week of moderate-intensity, or 75 minutes (1 hour and 15 minutes) a week of vigorous-intensity aerobic physical activity, or an equivalent combination of moderate- and vigorous-intensity aerobic activity. Aerobic activity should be performed in episodes of at least 10 minutes, and preferably, it should be spread throughout the week.  Behavioral modification strategies: continue lean protein intake and increasing water intake.  Scott Wolfe has agreed to follow-up with our clinic in 3 weeks. He was informed of the importance of frequent follow-up visits to maximize his success with intensive lifestyle modifications for his multiple health conditions.   Objective:   Blood pressure 121/71, pulse 74, temperature 97.8 F (36.6 C), temperature source Oral, height 5\' 11"  (1.803 m), weight 277 lb (125.6 kg), SpO2 97 %. Body mass index is 38.63 kg/m.  General: Cooperative, alert, well developed, in no acute distress. HEENT: Conjunctivae and lids unremarkable. Cardiovascular: Regular rhythm.  Lungs: Normal work of breathing. Neurologic: No focal deficits.   Lab Results  Component Value Date   CREATININE 1.17 05/30/2020   BUN 25 05/30/2020   NA 143 05/30/2020   K 4.9 05/30/2020   CL 105 05/30/2020   CO2 24 05/30/2020   Lab Results  Component Value Date   ALT 46 (H) 05/30/2020   AST 37 05/30/2020   ALKPHOS 52 05/30/2020   BILITOT 1.2 05/30/2020   Lab Results  Component Value Date   HGBA1C 5.3 05/30/2020   HGBA1C 6.6 (H) 11/03/2019   Lab Results    Component Value Date   INSULIN 7.5 01/04/2020   INSULIN 125.0 (H) 11/03/2019   Lab Results  Component Value Date   TSH 1.910 11/03/2019   Lab Results  Component Value Date   CHOL 100 05/30/2020   HDL 55 05/30/2020   LDLCALC 29 05/30/2020   TRIG 75 05/30/2020   CHOLHDL 1.8 05/30/2020   Lab Results  Component Value Date   WBC 5.7 05/30/2020   HGB 17.0 05/30/2020   HCT 49.6 05/30/2020   MCV 96 05/30/2020   PLT 106 (L) 05/30/2020   Lab Results  Component Value Date   IRON 124 11/03/2019   TIBC 419 11/03/2019   FERRITIN 269 11/03/2019   Attestation Statements:   Reviewed by clinician on day of visit: allergies, medications, problem list, medical history, surgical history, family history, social history, and previous encounter notes.  I, Water quality scientist, CMA, am acting as transcriptionist for Briscoe Deutscher, DO  I have reviewed the above documentation for accuracy and completeness, and I agree with the above. Briscoe Deutscher, DO

## 2020-06-10 ENCOUNTER — Other Ambulatory Visit (INDEPENDENT_AMBULATORY_CARE_PROVIDER_SITE_OTHER): Payer: Self-pay | Admitting: Adult Health

## 2020-06-10 DIAGNOSIS — E1169 Type 2 diabetes mellitus with other specified complication: Secondary | ICD-10-CM

## 2020-06-21 ENCOUNTER — Other Ambulatory Visit: Payer: Self-pay

## 2020-06-21 ENCOUNTER — Encounter (INDEPENDENT_AMBULATORY_CARE_PROVIDER_SITE_OTHER): Payer: Self-pay | Admitting: Family Medicine

## 2020-06-21 ENCOUNTER — Ambulatory Visit (INDEPENDENT_AMBULATORY_CARE_PROVIDER_SITE_OTHER): Payer: 59 | Admitting: Family Medicine

## 2020-06-21 VITALS — BP 148/82 | HR 76 | Temp 97.9°F | Ht 71.0 in | Wt 280.0 lb

## 2020-06-21 DIAGNOSIS — R5383 Other fatigue: Secondary | ICD-10-CM

## 2020-06-21 DIAGNOSIS — R7401 Elevation of levels of liver transaminase levels: Secondary | ICD-10-CM

## 2020-06-21 DIAGNOSIS — E1169 Type 2 diabetes mellitus with other specified complication: Secondary | ICD-10-CM

## 2020-06-21 DIAGNOSIS — E559 Vitamin D deficiency, unspecified: Secondary | ICD-10-CM | POA: Diagnosis not present

## 2020-06-21 DIAGNOSIS — Z6839 Body mass index (BMI) 39.0-39.9, adult: Secondary | ICD-10-CM

## 2020-06-21 NOTE — Progress Notes (Signed)
Chief Complaint:   OBESITY Scott Wolfe is here to discuss his progress with his obesity treatment plan along with follow-up of his obesity related diagnoses.   Today's visit was #: 11 Starting weight: 326 lbs Starting date: 11/03/2019 Today's weight: 280 lbs Today's date: 06/22/2020 Total lbs lost to date: 46 lbs Body mass index is 39.05 kg/m.  Total weight loss percentage to date: -14.11%  Interim History: Scott Wolfe says that he has been under more fatigue recently and is obtaining intermittent FMLA through his PCP.  Fatigue is worse in the morning.  His PSA has been trending up, per his report. He is using his CPAP consistently.  Says that in the evening he is "dragging".  He spends the weekends catching up on sleep.  Work days are 12+ hours. He will be retiring in 11 months.  Nutrition Plan: Category 4 for 90% of the time. Anti-obesity medications: Ozempic. Reported side effects: None. Hunger is well controlled controlled. Cravings are well controlled controlled.  Activity: Walking 3,500 steps 7 times per week. Sleep: Number of hours slept each night: 7. Sleep is not restful.   Assessment/Plan:   1. Vitamin D deficiency Current vitamin D is 51.3, tested on 05/30/2020. Not at goal. Optimal goal > 50 ng/dL.   Plan: Continue Vitamin D @50 ,000 IU every week with follow-up for routine testing of Vitamin D at least 2-3 times per year to avoid over-replacement.  2. Elevated ALT measurement Continuing to improve.  Scott Wolfe has known fatty liver, see [place] 05/16/18. Treatment includes weight loss, elimination of sweet drinks, including juice, avoidance of high fructose corn syrup, and exercise. As always, avoiding alcohol consumption is important.  Lab Results  Component Value Date   ALT 46 (H) 05/30/2020   AST 37 05/30/2020   ALKPHOS 52 05/30/2020   BILITOT 1.2 05/30/2020   3. Type 2 diabetes mellitus with other specified complication, without long-term current use of insulin ([facility]) Diabetes  [place]: improved. Medication: Ozempic. Issues reviewed with him: blood sugar goals, complications of diabetes mellitus, hypoglycemia prevention and treatment, exercise, nutrition, and carbohydrate counting.   Lab Results  Component Value Date   HGBA1C 5.3 05/30/2020   HGBA1C 6.6 (H) 11/03/2019   Lab Results  Component Value Date   LDLCALC 29 05/30/2020   CREATININE 1.17 05/30/2020   4. Other fatigue Uncontrolled.  Likely related to work overwhelm. He does think that the fatigue has worsened since he had COVID months ago. He is working on this with his PCP. We will continue to monitor symptoms as they relate to his weight loss journey.  5. Class 2 severe obesity with serious comorbidity and body mass index (BMI) of 39.0 to 39.9 in adult, unspecified obesity type ([facility])  Scott Wolfe is currently in the action stage of change. As such, his goal is to continue with weight loss efforts.   Nutrition goals: He has agreed to the Category 4 Plan.   Exercise goals: For substantial health benefits, adults should do at least 150 minutes (2 hours and 30 minutes) a week of moderate-intensity, or 75 minutes (1 hour and 15 minutes) a week of vigorous-intensity aerobic physical activity, or an equivalent combination of moderate- and vigorous-intensity aerobic activity. Aerobic activity should be performed in episodes of at least 10 minutes, and preferably, it should be spread throughout the week.   Make sure fatigue is not decreasing blood sugar.  Proactively eat snack - fiber, healthy fat, protein.  Behavioral modification strategies: increasing lean protein intake, decreasing simple  carbohydrates, increasing vegetables, increasing water intake and increasing high fiber foods.  Scott Wolfe has agreed to follow-up with our clinic in 3 weeks. He was informed of the importance of frequent follow-up visits to maximize his success with intensive lifestyle modifications for his multiple health conditions.   Objective:    Blood pressure (!) 148/82, pulse 76, temperature 97.9 F (36.6 C), temperature source Oral, height 5\' 11"  (1.803 m), weight 280 lb (127 kg), SpO2 98 %. Body mass index is 39.05 kg/m.  General: Cooperative, alert, well developed, in no acute distress. HEENT: Conjunctivae and lids unremarkable. Cardiovascular: Regular rhythm.  Lungs: Normal work of breathing. Neurologic: No focal deficits.   Lab Results  Component Value Date   CREATININE 1.17 05/30/2020   BUN 25 05/30/2020   NA 143 05/30/2020   K 4.9 05/30/2020   CL 105 05/30/2020   CO2 24 05/30/2020   Lab Results  Component Value Date   ALT 46 (H) 05/30/2020   AST 37 05/30/2020   ALKPHOS 52 05/30/2020   BILITOT 1.2 05/30/2020   Lab Results  Component Value Date   HGBA1C 5.3 05/30/2020   HGBA1C 6.6 (H) 11/03/2019   Lab Results  Component Value Date   INSULIN 7.5 01/04/2020   INSULIN 125.0 (H) 11/03/2019   Lab Results  Component Value Date   TSH 1.910 11/03/2019   Lab Results  Component Value Date   CHOL 100 05/30/2020   HDL 55 05/30/2020   LDLCALC 29 05/30/2020   TRIG 75 05/30/2020   CHOLHDL 1.8 05/30/2020   Lab Results  Component Value Date   WBC 5.7 05/30/2020   HGB 17.0 05/30/2020   HCT 49.6 05/30/2020   MCV 96 05/30/2020   PLT 106 (L) 05/30/2020   Lab Results  Component Value Date   IRON 124 11/03/2019   TIBC 419 11/03/2019   FERRITIN 269 11/03/2019   Attestation Statements:   Reviewed by clinician on day of visit: allergies, medications, problem list, medical history, surgical history, family history, social history, and previous encounter notes.  I, Water quality scientist, CMA, am acting as transcriptionist for Briscoe Deutscher, DO  I have reviewed the above documentation for accuracy and completeness, and I agree with the above. Briscoe Deutscher, DO

## 2020-07-12 ENCOUNTER — Telehealth: Payer: Self-pay

## 2020-07-12 ENCOUNTER — Other Ambulatory Visit: Payer: Self-pay | Admitting: Physician Assistant

## 2020-07-12 ENCOUNTER — Telehealth (INDEPENDENT_AMBULATORY_CARE_PROVIDER_SITE_OTHER): Payer: 59 | Admitting: Physician Assistant

## 2020-07-12 ENCOUNTER — Other Ambulatory Visit: Payer: Self-pay

## 2020-07-12 ENCOUNTER — Encounter: Payer: Self-pay | Admitting: Physician Assistant

## 2020-07-12 VITALS — BP 119/78 | HR 80 | Ht 72.0 in | Wt 278.0 lb

## 2020-07-12 DIAGNOSIS — I10 Essential (primary) hypertension: Secondary | ICD-10-CM | POA: Diagnosis not present

## 2020-07-12 DIAGNOSIS — R918 Other nonspecific abnormal finding of lung field: Secondary | ICD-10-CM

## 2020-07-12 DIAGNOSIS — R0602 Shortness of breath: Secondary | ICD-10-CM | POA: Diagnosis not present

## 2020-07-12 NOTE — Progress Notes (Signed)
Virtual Visit via Telephone Note   This visit type was conducted due to national recommendations for restrictions regarding the COVID-19 Pandemic (e.g. social distancing) in an effort to limit this patient's exposure and mitigate transmission in our community.  Due to his co-morbid illnesses, this patient is at least at moderate risk for complications without adequate follow up.  This format is felt to be most appropriate for this patient at this time.  The patient did not have access to video technology/had technical difficulties with video requiring transitioning to audio format only (telephone).  All issues noted in this document were discussed and addressed.  No physical exam could be performed with this format.  Please refer to the patient's chart for his  consent to telehealth for [facility].    Date:  07/12/2020   ID:  Scott Wolfe, DOB [birth year], MRN [id number] The patient was identified using 2 identifiers.  Patient Location: Home Provider Location: Home Office  PCP:  London Pepper, MD  Cardiologist:  Mertie Moores, MD  Electrophysiologist:  None   Evaluation Performed:  Follow-Up Visit  Chief Complaint:  6 months follow up   History of Present Illness:    Scott Wolfe is a 59 y.o. male with with history of hypertension, hyperlipidemia, lung nodule and morbid obesity seen for follow-up.  Establish care with Dr. Acie Fredrickson Dec 2020 for evaluation of shortness of breath. Echocardiogram showed preserved LV function.  Felt his shortness of breath is secondary to weight . Had Covid in January 2021.    Patient seen for virtual follow-up.  He has at [facility] weight management program.  He lost 48 pounds since March of this year.  Reports continues to have shortness of breath as well as fatigue.  He thinks this is due to Covid.  Denies associated chest tightness.  No palpitation, orthopnea, PND, syncope, lower extremity edema or melena.  Compliant with his medication.  The patient  does not have symptoms concerning for COVID-19 infection (fever, chills, cough, or new shortness of breath).    Past Medical History:  Diagnosis Date  . ADHD (attention deficit hyperactivity disorder)   . Borderline diabetes   . COVID-19 virus infection    January 2021, contd sxs, Taste/smell, fogginess,headache, nausea,tremor,   . Dyspnea   . Elevated liver enzymes   . Hypercholesterolemia   . Hypertension   . Kidney stone   . Lower extremity edema   . Obesity   . OCD (obsessive compulsive disorder)   . Prediabetes   . Renal disorder   . Sleep apnea    uses CPAP nightly   Past Surgical History:  Procedure Laterality Date  . HERNIA REPAIR    . KNEE ARTHROSCOPY WITH MEDIAL MENISECTOMY Left 09/10/2014   Procedure: LEFT KNEE ARTHROSCOPY WITH DEBRIDEMENT AND  MEDIAL MENISECTOMY;  Surgeon: Renette Butters, MD;  Location: [facility];  Service: Orthopedics;  Laterality: Left;     Current Meds  Medication Sig  . aspirin 81 MG tablet Take 1 tablet (81 mg total) by mouth daily.  . cetirizine (ZYRTEC) 10 MG tablet Take 10 mg by mouth daily as needed for allergies.  . Evolocumab (REPATHA SURECLICK) 595 MG/ML SOAJ Inject 1 pen into the skin every 14 (fourteen) days.  . fluticasone-salmeterol (ADVAIR HFA) 115-21 MCG/ACT inhaler Inhale 2 puffs into the lungs 2 (two) times daily.  . Glucosamine-Chondroitin (OSTEO BI-FLEX REGULAR STRENGTH PO) Take 1 tablet by mouth daily.  . indapamide (LOZOL) 1.25 MG tablet Take 1.25 mg  by mouth daily.  Marland Kitchen lisdexamfetamine (VYVANSE) 60 MG capsule Take 60 mg by mouth every morning.  Marland Kitchen lisinopril (ZESTRIL) 20 MG tablet Take 20 mg by mouth daily.   . Multiple Vitamin (MULTIVITAMIN WITH MINERALS) TABS tablet Take 1 tablet by mouth daily.  Marland Kitchen OZEMPIC, 0.25 OR 0.5 MG/DOSE, 2 MG/1.5ML SOPN ADMINISTER 0.5 MG UNDER THE SKIN 1 TIME A WEEK  . rosuvastatin (CRESTOR) 10 MG tablet Take 1 tablet (10 mg total) by mouth daily.  . traZODone (DESYREL) 50 MG tablet  TAKE 1 TABLET(50 MG) BY MOUTH AT BEDTIME AS NEEDED FOR SLEEP     Allergies:   Patient has no known allergies.   Social History   Tobacco Use  . Smoking status: Never Smoker  . Smokeless tobacco: Never Used  Vaping Use  . Vaping Use: Never used  Substance Use Topics  . Alcohol use: Yes    Comment: social  . Drug use: No     Family Hx: The patient's family history includes Aneurysm in his mother; Sudden death in his mother.  ROS:   Please see the history of present illness.    All other systems reviewed and are negative.   Prior CV studies:   The following studies were reviewed today:  CT cardiac scoring 08/2019 IMPRESSION: Coronary calcium score of 114. This was 43 percentile for age and sex matched control. This is greater than expected for age and sex matched peers.  Echo 08/2019 1. Left ventricular ejection fraction, by visual estimation, is 60 to  65%. The left ventricle has normal function. There is mildly increased  left ventricular hypertrophy.  2. The left ventricle has no regional wall motion abnormalities.  3. Global right ventricle has normal systolic function.The right  ventricular size is normal. No increase in right ventricular wall  thickness.  4. Left atrial size was normal.  5. Right atrial size was normal.  6. The mitral valve is normal in structure. No evidence of mitral valve  regurgitation. No evidence of mitral stenosis.  7. The tricuspid valve is normal in structure. Tricuspid valve  regurgitation is trivial.  8. The aortic valve is normal in structure. Aortic valve regurgitation is  not visualized. No evidence of aortic valve sclerosis or stenosis.  9. The pulmonic valve was normal in structure. Pulmonic valve  regurgitation is not visualized.  10. Normal pulmonary artery systolic pressure.  11. The tricuspid regurgitant velocity is 2.50 m/s, and with an assumed  right atrial pressure of 3 mmHg, the estimated right ventricular  systolic  pressure is normal at 28.0 mmHg.  12. The inferior vena cava is normal in size with greater than 50%  respiratory variability, suggesting right atrial pressure of 3 mmHg.   Labs/Other Tests and Data Reviewed:    EKG:  No ECG reviewed.  Recent Labs: 11/03/2019: TSH 1.910 05/30/2020: ALT 46; BUN 25; Creatinine, Ser 1.17; Hemoglobin 17.0; Platelets 106; Potassium 4.9; Sodium 143   Recent Lipid Panel Lab Results  Component Value Date/Time   CHOL 100 05/30/2020 09:50 AM   TRIG 75 05/30/2020 09:50 AM   HDL 55 05/30/2020 09:50 AM   CHOLHDL 1.8 05/30/2020 09:50 AM   LDLCALC 29 05/30/2020 09:50 AM    Wt Readings from Last 3 Encounters:  07/12/20 278 lb (126.1 kg)  06/21/20 280 lb (127 kg)  05/30/20 277 lb (125.6 kg)     Risk Assessment/Calculations:      Objective:    Vital Signs:  BP 119/78   Pulse 80  Ht 6' (1.829 m)   Wt 278 lb (126.1 kg)   BMI 37.70 kg/m    VITAL SIGNS:  reviewed GEN:  no acute distress PSYCH:  normal affect  ASSESSMENT & PLAN:    1. HTN - BP stable and well controlled on current medications.  2.  Shortness of breath/fatigue -Patient reported stable shortness of breath and fatigue since diagnosed with Covid earlier this year.  Symptoms stable despite losing 48 pounds.  He denies any exertional chest tightness or pressure.  Coronary calcium score of 114 by cardiac CT scoring December 2020.  Chest CT July 21 showed diffuse three-vessel coronary artery calcification.  He does have cardiac risk factors.  However, his shortness of breath is stable without chest discomfort.  I have encouraged him to continue aspirin and statin.  He will let us know if worsening symptoms.  May consider ischemic evaluation at some point.  3.  Lung nodules -Stable by recent CT of chest  COVID-19 Education: The signs and symptoms of COVID-19 were discussed with the patient and how to seek care for testing (follow up with PCP or arrange E-visit).  The importance of  social distancing was discussed today.  Time:   Today, I have spent 8 minutes with the patient with telehealth technology discussing the above problems.     Medication Adjustments/Labs and Tests Ordered: Current medicines are reviewed at length with the patient today.  Concerns regarding medicines are outlined above.   Tests Ordered: No orders of the defined types were placed in this encounter.   Medication Changes: No orders of the defined types were placed in this encounter.   Follow Up:  In Person in 6 month(s)  Signed, Leanor Kail, [place]  07/12/2020 12:46 PM    [place]

## 2020-07-12 NOTE — Telephone Encounter (Signed)
  Patient Consent for Virtual Visit         Scott Wolfe has provided verbal consent on 07/12/2020 for a virtual visit (video or telephone).   CONSENT FOR VIRTUAL VISIT FOR:  Scott Wolfe  By participating in this virtual visit I agree to the following:  I hereby voluntarily request, consent and authorize [facility] and its employed or contracted physicians, physician assistants, nurse practitioners or other licensed health care professionals (the Practitioner), to provide me with telemedicine health care services (the "Services") as deemed necessary by the treating Practitioner. I acknowledge and consent to receive the Services by the Practitioner via telemedicine. I understand that the telemedicine visit will involve communicating with the Practitioner through live audiovisual communication technology and the disclosure of certain medical information by electronic transmission. I acknowledge that I have been given the opportunity to request an in-person assessment or other available alternative prior to the telemedicine visit and am voluntarily participating in the telemedicine visit.  I understand that I have the right to withhold or withdraw my consent to the use of telemedicine in the course of my care at any time, without affecting my right to future care or treatment, and that the Practitioner or I may terminate the telemedicine visit at any time. I understand that I have the right to inspect all information obtained and/or recorded in the course of the telemedicine visit and may receive copies of available information for a reasonable fee.  I understand that some of the potential risks of receiving the Services via telemedicine include:  Marland Kitchen Delay or interruption in medical evaluation due to technological equipment failure or disruption; . Information transmitted may not be sufficient (e.g. poor resolution of images) to allow for appropriate medical decision making by the Practitioner; and/or    . In rare instances, security protocols could fail, causing a breach of personal health information.  Furthermore, I acknowledge that it is my responsibility to provide information about my medical history, conditions and care that is complete and accurate to the best of my ability. I acknowledge that Practitioner's advice, recommendations, and/or decision may be based on factors not within their control, such as incomplete or inaccurate data provided by me or distortions of diagnostic images or specimens that may result from electronic transmissions. I understand that the practice of medicine is not an exact science and that Practitioner makes no warranties or guarantees regarding treatment outcomes. I acknowledge that a copy of this consent can be made available to me via my patient portal ([place]), or I can request a printed copy by calling the office of [facility].    I understand that my insurance will be billed for this visit.   I have read or had this consent read to me. . I understand the contents of this consent, which adequately explains the benefits and risks of the Services being provided via telemedicine.  . I have been provided ample opportunity to ask questions regarding this consent and the Services and have had my questions answered to my satisfaction. . I give my informed consent for the services to be provided through the use of telemedicine in my medical care

## 2020-07-12 NOTE — Patient Instructions (Signed)

## 2020-07-18 ENCOUNTER — Ambulatory Visit (INDEPENDENT_AMBULATORY_CARE_PROVIDER_SITE_OTHER): Payer: 59 | Admitting: Family Medicine

## 2020-07-18 ENCOUNTER — Other Ambulatory Visit: Payer: Self-pay

## 2020-07-18 ENCOUNTER — Encounter (INDEPENDENT_AMBULATORY_CARE_PROVIDER_SITE_OTHER): Payer: Self-pay | Admitting: Family Medicine

## 2020-07-18 VITALS — BP 135/84 | HR 75 | Temp 98.3°F | Ht 72.0 in | Wt 277.0 lb

## 2020-07-18 DIAGNOSIS — E66812 Obesity, class 2: Secondary | ICD-10-CM

## 2020-07-18 DIAGNOSIS — I10 Essential (primary) hypertension: Secondary | ICD-10-CM

## 2020-07-18 DIAGNOSIS — R0602 Shortness of breath: Secondary | ICD-10-CM

## 2020-07-18 DIAGNOSIS — R5383 Other fatigue: Secondary | ICD-10-CM | POA: Diagnosis not present

## 2020-07-18 DIAGNOSIS — Z9189 Other specified personal risk factors, not elsewhere classified: Secondary | ICD-10-CM | POA: Diagnosis not present

## 2020-07-18 DIAGNOSIS — Z6837 Body mass index (BMI) 37.0-37.9, adult: Secondary | ICD-10-CM

## 2020-07-18 DIAGNOSIS — F3289 Other specified depressive episodes: Secondary | ICD-10-CM

## 2020-07-27 NOTE — Progress Notes (Signed)
Chief Complaint:   OBESITY Supreme is here to discuss his progress with his obesity treatment plan along with follow-up of his obesity related diagnoses.   Today's visit was #: 12 Starting weight: 326 lbs Starting date: 11/03/2019 Today's weight: 277 lbs Today's date: 07/18/2020 Total lbs lost to date: 49 lbs Body mass index is 37.57 kg/m.  Total weight loss percentage to date: -15.03%  Interim History:  I have reviewed [facility] visit.  He needs Pulmonology follow-up.  He has put in his letter of retirement for 11/21/2021.  Nutrition Plan: the Category 4 Plan for 90% of the time.  Anti-obesity medications: Ozempic.  Activity: 3,000 steps per day 7 days per week.  Assessment/Plan:   1. SOB (shortness of breath) on exertion Jaquae endorses shortness of breath on exertion.  He has been seen by Cardiology.  Will refer back to Pulmonology today since he was lost to follow up after COVID course.   - Ambulatory referral to Pulmonology  2. Other fatigue Adonias has endorsed more fatigue than normal recently. Reviewed Cardiology note. We will continue to monitor symptoms as they relate to his weight loss journey. This issue directly impacts care plan for optimization of BMI and metabolic health as it impacts the patient's ability to make lifestyle changes.  3. Essential hypertension At goal. Medications: lisinopril.   Plan: Avoid buying foods that are: processed, frozen, or prepackaged to avoid excess salt. We will continue to monitor symptoms as they relate to his weight loss journey.  BP Readings from Last 3 Encounters:  07/18/20 135/84  07/12/20 119/78  06/21/20 (!) 148/82   Lab Results  Component Value Date   CREATININE 1.17 05/30/2020   4. Other depression, with emotional eating Worsening. Discussed cues and consequences, how thoughts affect eating, model of thoughts, feelings, and behaviors, and strategies for change by focusing on the cue. Discussed cognitive  distortions, coping thoughts, and how to change your thoughts.  5. At risk for heart disease [place] was given approximately 9 minutes of coronary artery disease prevention counseling today. He is 59 y.o. male and has risk factors for heart disease including obesity. We rediscussed intensive lifestyle modifications today with an emphasis on specific weight loss instructions and strategies.   6. Class 2 severe obesity with serious comorbidity and body mass index (BMI) of 37.0 to 37.9 in adult, unspecified obesity type ([place])  Course: Parvin is currently in the action stage of change. As such, his goal is to continue with weight loss efforts.   Nutrition goals: He has agreed to the Category 4 Plan.   Exercise goals: As tolerated.  Behavioral modification strategies: increasing lean protein intake, decreasing simple carbohydrates, increasing vegetables, increasing water intake and emotional eating strategies.  Obi has agreed to follow-up with our clinic in 3 weeks. He was informed of the importance of frequent follow-up visits to maximize his success with intensive lifestyle modifications for his multiple health conditions.   Objective:   Blood pressure 135/84, pulse 75, temperature 98.3 F (36.8 C), height 6' (1.829 m), weight 277 lb (125.6 kg), SpO2 100 %. Body mass index is 37.57 kg/m.  General: Cooperative, alert, well developed, in no acute distress. HEENT: Conjunctivae and lids unremarkable. Cardiovascular: Regular rhythm.  Lungs: Normal work of breathing. Neurologic: No focal deficits.   Lab Results  Component Value Date   CREATININE 1.17 05/30/2020   BUN 25 05/30/2020   NA 143 05/30/2020   K 4.9 05/30/2020   CL 105 05/30/2020  CO2 24 05/30/2020   Lab Results  Component Value Date   ALT 46 (H) 05/30/2020   AST 37 05/30/2020   ALKPHOS 52 05/30/2020   BILITOT 1.2 05/30/2020   Lab Results  Component Value Date   HGBA1C 5.3 05/30/2020   HGBA1C 6.6 (H) 11/03/2019   Lab  Results  Component Value Date   INSULIN 7.5 01/04/2020   INSULIN 125.0 (H) 11/03/2019   Lab Results  Component Value Date   TSH 1.910 11/03/2019   Lab Results  Component Value Date   CHOL 100 05/30/2020   HDL 55 05/30/2020   LDLCALC 29 05/30/2020   TRIG 75 05/30/2020   CHOLHDL 1.8 05/30/2020   Lab Results  Component Value Date   WBC 5.7 05/30/2020   HGB 17.0 05/30/2020   HCT 49.6 05/30/2020   MCV 96 05/30/2020   PLT 106 (L) 05/30/2020   Lab Results  Component Value Date   IRON 124 11/03/2019   TIBC 419 11/03/2019   FERRITIN 269 11/03/2019   Attestation Statements:   Reviewed by clinician on day of visit: allergies, medications, problem list, medical history, surgical history, family history, social history, and previous encounter notes.  I, Water quality scientist, CMA, am acting as transcriptionist for Briscoe Deutscher, DO  I have reviewed the above documentation for accuracy and completeness, and I agree with the above. Briscoe Deutscher, DO

## 2020-08-03 ENCOUNTER — Encounter (INDEPENDENT_AMBULATORY_CARE_PROVIDER_SITE_OTHER): Payer: Self-pay | Admitting: Family Medicine

## 2020-08-10 ENCOUNTER — Encounter (INDEPENDENT_AMBULATORY_CARE_PROVIDER_SITE_OTHER): Payer: Self-pay | Admitting: Family Medicine

## 2020-08-10 ENCOUNTER — Ambulatory Visit (INDEPENDENT_AMBULATORY_CARE_PROVIDER_SITE_OTHER): Payer: 59 | Admitting: Family Medicine

## 2020-08-10 ENCOUNTER — Other Ambulatory Visit: Payer: Self-pay

## 2020-08-10 VITALS — BP 132/87 | HR 69 | Temp 97.9°F | Ht 71.0 in | Wt 282.0 lb

## 2020-08-10 DIAGNOSIS — G4709 Other insomnia: Secondary | ICD-10-CM

## 2020-08-10 DIAGNOSIS — F908 Attention-deficit hyperactivity disorder, other type: Secondary | ICD-10-CM

## 2020-08-10 DIAGNOSIS — Z6839 Body mass index (BMI) 39.0-39.9, adult: Secondary | ICD-10-CM

## 2020-08-10 DIAGNOSIS — E7849 Other hyperlipidemia: Secondary | ICD-10-CM | POA: Diagnosis not present

## 2020-08-10 DIAGNOSIS — U099 Post covid-19 condition, unspecified: Secondary | ICD-10-CM | POA: Diagnosis not present

## 2020-08-10 DIAGNOSIS — F3289 Other specified depressive episodes: Secondary | ICD-10-CM

## 2020-08-11 NOTE — Progress Notes (Signed)
Chief Complaint:   OBESITY Scott Wolfe is here to discuss his progress with his obesity treatment plan along with follow-up of his obesity related diagnoses.   Today's visit was #: 13 Starting weight: 326 lbs Starting date: 11/03/2019 Today's weight: 282 lbs Today's date: 08/10/2020 Total lbs lost to date: 44 lbs Body mass index is 39.33 kg/m.  Total weight loss percentage to date: -13.50%  Interim History: Scott Wolfe endorses anhedonia.  Dr. Noemi Chapel is his psychiatrist at [facility]. Nutrition Plan: the Category 4 Plan for 70% of the time.  Anti-obesity medications: Ozempic. Reported side effects: None. Activity: Walking 3,000 steps 7 days per week.  Assessment/Plan:   1. Other insomnia Plan: Recommend sleep hygiene measures including regular sleep schedule, optimal sleep environment, and relaxing presleep rituals. Medication: trazodone 50 mg as needed at bedtime..  2. Other hyperlipidemia Lipid-lowering medications: Crestor 10 mg daily.   Plan: Dietary changes: Increase soluble fiber. Decrease simple carbohydrates. Exercise changes: An average 40 minutes of moderate to vigorous-intensity aerobic activity 3 or 4 times per week.   Lab Results  Component Value Date   CHOL 100 05/30/2020   HDL 55 05/30/2020   LDLCALC 29 05/30/2020   TRIG 75 05/30/2020   CHOLHDL 1.8 05/30/2020   Lab Results  Component Value Date   ALT 46 (H) 05/30/2020   AST 37 05/30/2020   ALKPHOS 52 05/30/2020   BILITOT 1.2 05/30/2020   3. COVID-19 long hauler We will continue to monitor. We will continue to monitor symptoms as they relate to his weight loss journey.  4. Attention deficit hyperactivity disorder (ADHD), other type Scott Wolfe is taking Vyvanse 60 mg daily.   5. Other depression, with emotional eating Scott Wolfe is on no medication for this. We discussed Lexapro v Prozac v Wellbutrin. He would like approval from Psychiatry, I agree. Behavior modification techniques were discussed today to help  Scott Wolfe deal with his emotional/non-hunger eating behaviors.    6. Class 2 severe obesity with serious comorbidity and body mass index (BMI) of 39.0 to 39.9 in adult, unspecified obesity type Scott Wolfe)  Course: Scott Wolfe is currently in the action stage of change. As such, his goal is to continue with weight loss efforts.   Nutrition goals: He has agreed to the Category 4 Plan.   Exercise goals: For substantial health benefits, adults should do at least 150 minutes (2 hours and 30 minutes) a week of moderate-intensity, or 75 minutes (1 hour and 15 minutes) a week of vigorous-intensity aerobic physical activity, or an equivalent combination of moderate- and vigorous-intensity aerobic activity. Aerobic activity should be performed in episodes of at least 10 minutes, and preferably, it should be spread throughout the week.  Behavioral modification strategies: increasing lean protein intake, decreasing simple carbohydrates, increasing vegetables, increasing water intake, dealing with family or coworker sabotage, travel eating strategies and holiday eating strategies .  Scott Wolfe has agreed to follow-up with our clinic in 4 weeks. He was informed of the importance of frequent follow-up visits to maximize his success with intensive lifestyle modifications for his multiple health conditions.   Objective:   Blood pressure 132/87, pulse 69, temperature 97.9 F (36.6 C), temperature source Oral, height 5\' 11"  (1.803 m), weight 282 lb (127.9 kg), SpO2 98 %. Body mass index is 39.33 kg/m.  General: Cooperative, alert, well developed, in no acute distress. HEENT: Conjunctivae and lids unremarkable. Cardiovascular: Regular rhythm.  Lungs: Normal work of breathing. Neurologic: No focal deficits.   Lab Results  Component Value Date  CREATININE 1.17 05/30/2020   BUN 25 05/30/2020   NA 143 05/30/2020   K 4.9 05/30/2020   CL 105 05/30/2020   CO2 24 05/30/2020   Lab Results  Component Value Date   ALT 46 (H)  05/30/2020   AST 37 05/30/2020   ALKPHOS 52 05/30/2020   BILITOT 1.2 05/30/2020   Lab Results  Component Value Date   HGBA1C 5.3 05/30/2020   HGBA1C 6.6 (H) 11/03/2019   Lab Results  Component Value Date   INSULIN 7.5 01/04/2020   INSULIN 125.0 (H) 11/03/2019   Lab Results  Component Value Date   TSH 1.910 11/03/2019   Lab Results  Component Value Date   CHOL 100 05/30/2020   HDL 55 05/30/2020   LDLCALC 29 05/30/2020   TRIG 75 05/30/2020   CHOLHDL 1.8 05/30/2020   Lab Results  Component Value Date   WBC 5.7 05/30/2020   HGB 17.0 05/30/2020   HCT 49.6 05/30/2020   MCV 96 05/30/2020   PLT 106 (L) 05/30/2020   Lab Results  Component Value Date   IRON 124 11/03/2019   TIBC 419 11/03/2019   FERRITIN 269 11/03/2019   Attestation Statements:   Reviewed by clinician on day of visit: allergies, medications, problem list, medical history, surgical history, family history, social history, and previous encounter notes.  Time spent on visit including pre-visit chart review and post-visit care and charting was 45 minutes.   I, Water quality scientist, CMA, am acting as transcriptionist for Briscoe Deutscher, DO  I have reviewed the above documentation for accuracy and completeness, and I agree with the above. Briscoe Deutscher, DO

## 2020-08-17 ENCOUNTER — Encounter (INDEPENDENT_AMBULATORY_CARE_PROVIDER_SITE_OTHER): Payer: Self-pay | Admitting: Family Medicine

## 2020-08-18 NOTE — Telephone Encounter (Signed)
Last OV with Dr Wallace 

## 2020-08-24 ENCOUNTER — Encounter (INDEPENDENT_AMBULATORY_CARE_PROVIDER_SITE_OTHER): Payer: Self-pay | Admitting: Family Medicine

## 2020-08-24 NOTE — Telephone Encounter (Signed)
Please send RX for Wellbutrin

## 2020-08-25 MED ORDER — BUPROPION HCL ER (XL) 150 MG PO TB24: 150.0000 mg | ORAL_TABLET | Freq: Every day | ORAL | 1 refills | Status: DC

## 2020-08-28 ENCOUNTER — Other Ambulatory Visit (INDEPENDENT_AMBULATORY_CARE_PROVIDER_SITE_OTHER): Payer: Self-pay | Admitting: Adult Health

## 2020-08-28 DIAGNOSIS — E1169 Type 2 diabetes mellitus with other specified complication: Secondary | ICD-10-CM

## 2020-09-12 ENCOUNTER — Encounter (INDEPENDENT_AMBULATORY_CARE_PROVIDER_SITE_OTHER): Payer: Self-pay | Admitting: Family Medicine

## 2020-09-12 ENCOUNTER — Other Ambulatory Visit: Payer: Self-pay

## 2020-09-12 ENCOUNTER — Ambulatory Visit (INDEPENDENT_AMBULATORY_CARE_PROVIDER_SITE_OTHER): Payer: 59 | Admitting: Family Medicine

## 2020-09-12 VITALS — BP 123/76 | HR 72 | Temp 98.0°F | Ht 71.0 in | Wt 280.0 lb

## 2020-09-12 DIAGNOSIS — E1169 Type 2 diabetes mellitus with other specified complication: Secondary | ICD-10-CM

## 2020-09-12 DIAGNOSIS — Z9189 Other specified personal risk factors, not elsewhere classified: Secondary | ICD-10-CM | POA: Diagnosis not present

## 2020-09-12 DIAGNOSIS — F3289 Other specified depressive episodes: Secondary | ICD-10-CM | POA: Diagnosis not present

## 2020-09-12 DIAGNOSIS — G4709 Other insomnia: Secondary | ICD-10-CM

## 2020-09-12 DIAGNOSIS — Z6839 Body mass index (BMI) 39.0-39.9, adult: Secondary | ICD-10-CM

## 2020-09-12 MED ORDER — BUPROPION HCL ER (XL) 300 MG PO TB24: 300.0000 mg | ORAL_TABLET | Freq: Every day | ORAL | 3 refills | Status: DC

## 2020-09-13 MED ORDER — OZEMPIC (0.25 OR 0.5 MG/DOSE) 2 MG/1.5ML ~~LOC~~ SOPN: PEN_INJECTOR | SUBCUTANEOUS | 0 refills | Status: DC

## 2020-09-14 NOTE — Progress Notes (Signed)
Chief Complaint:   OBESITY Scott Wolfe is here to discuss his progress with his obesity treatment plan along with follow-up of his obesity related diagnoses.   Today's visit was #: 14 Starting weight: 326 lbs Starting date: 11/03/2019 Today's weight: 280 lbs Today's date: 09/12/2020 Total lbs lost to date: 46 lbs Body mass index is 39.05 kg/m.  Total weight loss percentage to date: -14.11%  Interim History: Scott Wolfe says that his work stress is worsening.  He has been on Wellbutrin for 3 weeks, and he says it is helping with vegetative symptoms.  No side effects.  He will get his COVID booster on the 23st.  Nutrition Plan: the Category 4 Plan for 75% of the time.  Anti-obesity medications: Ozempic. Reported side effects: None. Activity: Walking 3,500 steps 7 times per week. Stress: Elevated.  Assessment/Plan:   1. Other insomnia Scott Wolfe says he wakes at 2 am or so.  He is on call 24/7.  Plan: Will monitor.  Recommend sleep hygiene measures including regular sleep schedule, optimal sleep environment, and relaxing presleep rituals.  2. Type 2 diabetes mellitus with other specified complication, without long-term current use of insulin (HCC) Diabetes Mellitus: improved. Medication: Ozempic 0.5 mg subcutaneously weekly. Issues reviewed with him: blood sugar goals, complications of diabetes mellitus, hypoglycemia prevention and treatment, exercise, and nutrition.  Lab Results  Component Value Date   HGBA1C 5.3 05/30/2020   HGBA1C 6.6 (H) 11/03/2019   Lab Results  Component Value Date   LDLCALC 29 05/30/2020   CREATININE 1.17 05/30/2020   -Refill Semaglutide,0.25 or 0.5MG /DOS, (OZEMPIC, 0.25 OR 0.5 MG/DOSE,) 2 MG/1.5ML SOPN; ADMINISTER 0.5 MG UNDER THE SKIN 1 TIME A WEEK  Dispense: 3 mL; Refill: 0  3. Other depression, with emotional eating Scott Wolfe is taking Wellbutrin and says that it is helping.  Behavior modification techniques were discussed today to help Scott Wolfe deal with his  emotional/non-hunger eating behaviors.    Plan:  Increase Wellbutrin XL to 300 mg daily.  4. At risk for constipation Scott Wolfe was given approximately 9 minutes of counseling today regarding prevention of constipation. He was encouraged to increase water and fiber intake.   5. Class 2 severe obesity with serious comorbidity and body mass index (BMI) of 39.0 to 39.9 in adult, unspecified obesity type [facility])  Course: Scott Wolfe is currently in the action stage of change. As such, his goal is to continue with weight loss efforts.   Nutrition goals: He has agreed to the Category 4 Plan.   Exercise goals: For substantial health benefits, adults should do at least 150 minutes (2 hours and 30 minutes) a week of moderate-intensity, or 75 minutes (1 hour and 15 minutes) a week of vigorous-intensity aerobic physical activity, or an equivalent combination of moderate- and vigorous-intensity aerobic activity. Aerobic activity should be performed in episodes of at least 10 minutes, and preferably, it should be spread throughout the week.  Behavioral modification strategies: increasing lean protein intake, decreasing simple carbohydrates, increasing vegetables, increasing water intake and emotional eating strategies.  Scott Wolfe has agreed to follow-up with our clinic in 3 weeks. He was informed of the importance of frequent follow-up visits to maximize his success with intensive lifestyle modifications for his multiple health conditions.   Objective:   Blood pressure 123/76, pulse 72, temperature 98 F (36.7 C), temperature source Oral, height 5\' 11"  (1.803 m), weight 280 lb (127 kg), SpO2 100 %. Body mass index is 39.05 kg/m.  General: Cooperative, alert, well developed, in no acute distress.  HEENT: Conjunctivae and lids unremarkable. Cardiovascular: Regular rhythm.  Lungs: Normal work of breathing. Neurologic: No focal deficits.   Lab Results  Component Value Date   CREATININE 1.17 05/30/2020   BUN 25  05/30/2020   NA 143 05/30/2020   K 4.9 05/30/2020   CL 105 05/30/2020   CO2 24 05/30/2020   Lab Results  Component Value Date   ALT 46 (H) 05/30/2020   AST 37 05/30/2020   ALKPHOS 52 05/30/2020   BILITOT 1.2 05/30/2020   Lab Results  Component Value Date   HGBA1C 5.3 05/30/2020   HGBA1C 6.6 (H) 11/03/2019   Lab Results  Component Value Date   INSULIN 7.5 01/04/2020   INSULIN 125.0 (H) 11/03/2019   Lab Results  Component Value Date   TSH 1.910 11/03/2019   Lab Results  Component Value Date   CHOL 100 05/30/2020   HDL 55 05/30/2020   LDLCALC 29 05/30/2020   TRIG 75 05/30/2020   CHOLHDL 1.8 05/30/2020   Lab Results  Component Value Date   WBC 5.7 05/30/2020   HGB 17.0 05/30/2020   HCT 49.6 05/30/2020   MCV 96 05/30/2020   PLT 106 (L) 05/30/2020   Lab Results  Component Value Date   IRON 124 11/03/2019   TIBC 419 11/03/2019   FERRITIN 269 11/03/2019   Attestation Statements:   Reviewed by clinician on day of visit: allergies, medications, problem list, medical history, surgical history, family history, social history, and previous encounter notes.  I, Water quality scientist, CMA, am acting as transcriptionist for Briscoe Deutscher, DO  I have reviewed the above documentation for accuracy and completeness, and I agree with the above. Briscoe Deutscher, DO

## 2020-09-15 MED ORDER — OZEMPIC (0.25 OR 0.5 MG/DOSE) 2 MG/1.5ML ~~LOC~~ SOPN: PEN_INJECTOR | SUBCUTANEOUS | 0 refills | Status: DC

## 2020-09-16 ENCOUNTER — Other Ambulatory Visit (INDEPENDENT_AMBULATORY_CARE_PROVIDER_SITE_OTHER): Payer: Self-pay | Admitting: Adult Health

## 2020-09-16 DIAGNOSIS — G4709 Other insomnia: Secondary | ICD-10-CM

## 2020-09-30 ENCOUNTER — Other Ambulatory Visit: Payer: Self-pay | Admitting: Cardiovascular Disease

## 2020-09-30 DIAGNOSIS — E785 Hyperlipidemia, unspecified: Secondary | ICD-10-CM

## 2020-11-09 ENCOUNTER — Other Ambulatory Visit: Payer: Self-pay

## 2020-11-09 ENCOUNTER — Ambulatory Visit (INDEPENDENT_AMBULATORY_CARE_PROVIDER_SITE_OTHER): Payer: 59 | Admitting: Family Medicine

## 2020-11-09 ENCOUNTER — Encounter (INDEPENDENT_AMBULATORY_CARE_PROVIDER_SITE_OTHER): Payer: Self-pay | Admitting: Family Medicine

## 2020-11-09 VITALS — BP 131/76 | HR 78 | Temp 97.8°F | Ht 71.0 in | Wt 278.0 lb

## 2020-11-09 DIAGNOSIS — Z9189 Other specified personal risk factors, not elsewhere classified: Secondary | ICD-10-CM

## 2020-11-09 DIAGNOSIS — F3289 Other specified depressive episodes: Secondary | ICD-10-CM

## 2020-11-09 DIAGNOSIS — Z6838 Body mass index (BMI) 38.0-38.9, adult: Secondary | ICD-10-CM

## 2020-11-09 DIAGNOSIS — R5382 Chronic fatigue, unspecified: Secondary | ICD-10-CM

## 2020-11-09 DIAGNOSIS — G4709 Other insomnia: Secondary | ICD-10-CM

## 2020-11-09 MED ORDER — TRAZODONE HCL 50 MG PO TABS: ORAL_TABLET | ORAL | 0 refills | Status: DC

## 2020-11-10 NOTE — Progress Notes (Signed)
Chief Complaint:   OBESITY Scott Wolfe is here to discuss his progress with his obesity treatment plan along with follow-up of his obesity related diagnoses.   Today's visit was #: 15 Starting weight: 326 lbs Starting date: 11/03/2019 Today's weight: 278 lbs Today's date: 11/09/2020 Total lbs lost to date: 48 lbs Body mass index is 38.77 kg/m.  Total weight loss percentage to date: -14.72%  Interim History:  Scott Wolfe has 12 months until retirement.  He says he will be moving to [place] afterwards to be near family.  He admits to night eating when driving home, so switched from calorie-dense snacks to blueberries.  He has also noticed that he is smiling more often, feels that the Wellbutrin is helpful.  Current Meal Plan: the Category 4 Plan for 90% of the time.  Current Exercise Plan: Cardio/rowing for 30 minutes 3 times per week. Current Anti-Obesity Medications: Ozempic 0.5 mg subcutaneously weekly. Side effects: None.  Assessment/Plan:   1. Other insomnia This is poorly controlled. Dysfunction:  Wakes between 2-5 am if he does not take trazodone.  Current treatment: trazodone 50 mg at bedtime as needed for sleep.  Plan:  We discussed serotonin deficiency.  Recommend sleep hygiene measures including regular sleep schedule, optimal sleep environment, and relaxing presleep rituals.  We will continue to monitor this patient's condition along with his PCP.   - Refill traZODone (DESYREL) 50 MG tablet; TAKE 1 TABLET(50 MG) BY MOUTH AT BEDTIME AS NEEDED FOR SLEEP  Dispense: 90 tablet; Refill: 0  2. Chronic fatigue Improving except on working days.    Plan:  We discussed "energy catalog" for homework.  3. Other depression, with emotional eating Uncontrolled.  Medication: Wellbutrin XL 300 mg daily.  He is due to see Psychiatry soon.  He says that Wellbutrin is helping.  Plan:  Discussed that Psychiatry may want to increase his Wellbutrin to 450 mg daily.  Behavior modification techniques were  discussed today to help deal with emotional/non-hunger eating behaviors.  4. At risk for heart disease Due to [name]'s current state of health and medical condition(s), he is at a higher risk for heart disease.  This puts the patient at much greater risk to subsequently develop cardiopulmonary conditions that can significantly affect patient's quality of life in a negative manner.    At least 8 minutes were spent on counseling Scott Wolfe about these concerns today, and I stressed the importance of reversing risks factors of obesity, especially truncal and visceral fat. Counseling:  Intensive lifestyle modifications were discussed with Scott Wolfe as the most appropriate first line of treatment. Evidence-based interventions for health behavior change were utilized today including the discussion of self monitoring techniques, problem-solving barriers, and SMART goal setting techniques.  Specifically, regarding patient's less desirable eating habits and patterns, we employed the technique of small changes when Scott Wolfe has not been able to fully commit to his prudent nutritional plan.  5. Class 2 severe obesity with serious comorbidity and body mass index (BMI) of 38.0 to 38.9 in adult, unspecified obesity type Scott Wolfe)  Course: Scott Wolfe is currently in the action stage of change. As such, his goal is to continue with weight loss efforts.   Nutrition goals: He has agreed to the Category 4 Plan.   Exercise goals: Work on enjoyable activities.  Behavioral modification strategies: emotional eating strategies.  Scott Wolfe has agreed to follow-up with our clinic in 4 weeks. He was informed of the importance of frequent follow-up visits to maximize his success with intensive lifestyle modifications  for his multiple health conditions.   Objective:   Blood pressure 131/76, pulse 78, temperature 97.8 F (36.6 C), temperature source Oral, height 5\' 11"  (1.803 m), weight 278 lb (126.1 kg), SpO2 98 %. Body mass index is 38.77  kg/m.  General: Cooperative, alert, well developed, in no acute distress. HEENT: Conjunctivae and lids unremarkable. Cardiovascular: Regular rhythm.  Lungs: Normal work of breathing. Neurologic: No focal deficits.   Lab Results  Component Value Date   CREATININE 1.17 05/30/2020   BUN 25 05/30/2020   NA 143 05/30/2020   K 4.9 05/30/2020   CL 105 05/30/2020   CO2 24 05/30/2020   Lab Results  Component Value Date   ALT 46 (H) 05/30/2020   AST 37 05/30/2020   ALKPHOS 52 05/30/2020   BILITOT 1.2 05/30/2020   Lab Results  Component Value Date   HGBA1C 5.3 05/30/2020   HGBA1C 6.6 (H) 11/03/2019   Lab Results  Component Value Date   INSULIN 7.5 01/04/2020   INSULIN 125.0 (H) 11/03/2019   Lab Results  Component Value Date   TSH 1.910 11/03/2019   Lab Results  Component Value Date   CHOL 100 05/30/2020   HDL 55 05/30/2020   LDLCALC 29 05/30/2020   TRIG 75 05/30/2020   CHOLHDL 1.8 05/30/2020   Lab Results  Component Value Date   WBC 5.7 05/30/2020   HGB 17.0 05/30/2020   HCT 49.6 05/30/2020   MCV 96 05/30/2020   PLT 106 (L) 05/30/2020   Lab Results  Component Value Date   IRON 124 11/03/2019   TIBC 419 11/03/2019   FERRITIN 269 11/03/2019   Attestation Statements:   Reviewed by clinician on day of visit: allergies, medications, problem list, medical history, surgical history, family history, social history, and previous encounter notes.  I, Water quality scientist, CMA, am acting as transcriptionist for Briscoe Deutscher, DO  I have reviewed the above documentation for accuracy and completeness, and I agree with the above. Briscoe Deutscher, DO

## 2020-11-27 ENCOUNTER — Other Ambulatory Visit: Payer: Self-pay | Admitting: Cardiovascular Disease

## 2020-11-30 LAB — BASIC METABOLIC PANEL
BUN: 25 — AB (ref 4–21)
CO2: 29 — AB (ref 13–22)
Chloride: 104 (ref 99–108)
Creatinine: 1.1 (ref 0.6–1.3)
Glucose: 93
Potassium: 4.4 (ref 3.4–5.3)
Sodium: 139 (ref 137–147)

## 2020-11-30 LAB — HEPATIC FUNCTION PANEL
ALT: 44 — AB (ref 10–40)
AST: 26 (ref 14–40)
Alkaline Phosphatase: 39 (ref 25–125)
Bilirubin, Total: 0.8

## 2020-11-30 LAB — COMPREHENSIVE METABOLIC PANEL
Albumin: 4.2 (ref 3.5–5.0)
Calcium: 9.3 (ref 8.7–10.7)

## 2020-12-02 ENCOUNTER — Encounter (INDEPENDENT_AMBULATORY_CARE_PROVIDER_SITE_OTHER): Payer: Self-pay | Admitting: Family Medicine

## 2020-12-05 ENCOUNTER — Other Ambulatory Visit (INDEPENDENT_AMBULATORY_CARE_PROVIDER_SITE_OTHER): Payer: Self-pay | Admitting: Family Medicine

## 2020-12-05 NOTE — Telephone Encounter (Signed)
Last seen by Dr. Wallace. 

## 2020-12-06 ENCOUNTER — Other Ambulatory Visit: Payer: Self-pay

## 2020-12-06 ENCOUNTER — Ambulatory Visit (INDEPENDENT_AMBULATORY_CARE_PROVIDER_SITE_OTHER): Payer: 59 | Admitting: Family Medicine

## 2020-12-06 VITALS — BP 123/76 | HR 71 | Temp 97.6°F | Ht 71.0 in | Wt 275.0 lb

## 2020-12-06 DIAGNOSIS — R945 Abnormal results of liver function studies: Secondary | ICD-10-CM | POA: Insufficient documentation

## 2020-12-06 DIAGNOSIS — R911 Solitary pulmonary nodule: Secondary | ICD-10-CM | POA: Insufficient documentation

## 2020-12-06 DIAGNOSIS — Z6841 Body Mass Index (BMI) 40.0 and over, adult: Secondary | ICD-10-CM

## 2020-12-06 DIAGNOSIS — F32A Depression, unspecified: Secondary | ICD-10-CM | POA: Insufficient documentation

## 2020-12-06 DIAGNOSIS — E1159 Type 2 diabetes mellitus with other circulatory complications: Secondary | ICD-10-CM | POA: Diagnosis not present

## 2020-12-06 DIAGNOSIS — G4733 Obstructive sleep apnea (adult) (pediatric): Secondary | ICD-10-CM | POA: Diagnosis not present

## 2020-12-06 DIAGNOSIS — E1169 Type 2 diabetes mellitus with other specified complication: Secondary | ICD-10-CM

## 2020-12-06 DIAGNOSIS — F3289 Other specified depressive episodes: Secondary | ICD-10-CM

## 2020-12-06 DIAGNOSIS — K760 Fatty (change of) liver, not elsewhere classified: Secondary | ICD-10-CM | POA: Diagnosis not present

## 2020-12-06 DIAGNOSIS — Z9189 Other specified personal risk factors, not elsewhere classified: Secondary | ICD-10-CM | POA: Diagnosis not present

## 2020-12-06 DIAGNOSIS — Z860100 Personal history of colon polyps, unspecified: Secondary | ICD-10-CM | POA: Insufficient documentation

## 2020-12-06 DIAGNOSIS — N200 Calculus of kidney: Secondary | ICD-10-CM | POA: Insufficient documentation

## 2020-12-06 DIAGNOSIS — E785 Hyperlipidemia, unspecified: Secondary | ICD-10-CM

## 2020-12-06 DIAGNOSIS — Z9989 Dependence on other enabling machines and devices: Secondary | ICD-10-CM

## 2020-12-06 DIAGNOSIS — I152 Hypertension secondary to endocrine disorders: Secondary | ICD-10-CM

## 2020-12-06 NOTE — Progress Notes (Signed)
Chief Complaint:   OBESITY Scott Wolfe is here to discuss his progress with his obesity treatment plan along with follow-up of his obesity related diagnoses.   Today's visit was #: 68 Starting weight: 326 lbs Starting date: 11/03/2019 Today's weight: 275 lbs Today's date: 12/06/2020 Total lbs lost to date: 51 lbs Body mass index is 38.35 kg/m.  Total weight loss percentage to date: -15.64%  Interim History: Scott Wolfe says his PSA was rechecked and was normal. However, he is still positive for fatigue. He is taking 17 days off work. We discussed healthy habits to work on. For example: regular sleep schedule, walking trails, and pretend to be retired and do something fun (go to the [employer]).  Current Meal Plan: the Category 4 Plan for 90% of the time.  Current Exercise Plan: Cardio/walking for 20 minutes 7 times per week. Current Anti-Obesity Medications: Ozempic 0.5 mg subcutaneously weekly. Side effects: None.  Assessment/Plan:   1. Type 2 diabetes mellitus with other specified complication, without long-term current use of insulin (HCC) Diabetes Mellitus: Improved Medication: Ozempic 0.5 mg subcutaneously weekly. Issues reviewed: blood sugar goals, complications of diabetes mellitus, hypoglycemia prevention and treatment, exercise, and nutrition.   Plan: He will continue to focus on protein-rich, low simple carbohydrate foods. We reviewed the importance of hydration, regular exercise for stress reduction, and restorative sleep.   Lab Results  Component Value Date   HGBA1C 5.3 05/30/2020   HGBA1C 6.6 (H) 11/03/2019   Lab Results  Component Value Date   LDLCALC 29 05/30/2020   CREATININE 1.1 11/30/2020    2. Hypertension associated with type 2 diabetes mellitus ([place]) Controlled. Medications: Lisinopril 20 mg daily.   Plan: Avoid buying foods that are: processed, frozen, or prepackaged to avoid excess salt. We will continue to monitor closely alongside his PCP and/or  Specialist.    BP Readings from Last 3 Encounters:  12/06/20 123/76  11/09/20 131/76  09/12/20 123/76   Lab Results  Component Value Date   CREATININE 1.1 11/30/2020    3. Hyperlipidemia associated with type 2 diabetes mellitus ([place]) Course: At goal. Lipid-lowering medications: Crestor 10 mg daily.   Plan: Dietary changes: Increase soluble fiber, decrease simple carbohydrates, decrease saturated fat. Exercise changes: Moderate to vigorous-intensity aerobic activity 150 minutes per week or as tolerated. We will continue to monitor along with PCP/specialists as it pertains to his weight loss journey.  Lab Results  Component Value Date   CHOL 100 05/30/2020   HDL 55 05/30/2020   LDLCALC 29 05/30/2020   TRIG 75 05/30/2020   CHOLHDL 1.8 05/30/2020   Lab Results  Component Value Date   ALT 44 (A) 11/30/2020   AST 26 11/30/2020   ALKPHOS 39 11/30/2020   BILITOT 1.2 05/30/2020   The ASCVD Risk score [name] DC Jr., et al., 2013) failed to calculate for the following reasons:   The valid total cholesterol range is 130 to 320 mg/dL  4. NAFLD (nonalcoholic fatty liver disease) NAFLD is an umbrella term that encompasses a disease spectrum that includes steatosis (fat) without inflammation, steatohepatitis (NASH; fat + inflammation in a characteristic pattern), and cirrhosis. Bland steatosis is felt to be a benign condition, with extremely low to no risk of progression to cirrhosis, whereas NASH can progress to cirrhosis. The mainstay of treatment of NAFLD includes lifestyle modification to achieve weight loss, at least 7% of current body weight. Low carbohydrate diets can be beneficial in improving NAFLD liver histology. Additionally, exercise, even the absence of  weight loss can have beneficial effects on the patient's metabolic profile and liver health.   5. OSA on CPAP OSA is a cause of systemic hypertension and is associated with an increased incidence of stroke, heart failure, atrial  fibrillation, and coronary heart disease. Severe OSA increases all-cause mortality and  cardiovascular mortality.   Goal: Treatment of OSA via CPAP compliance and weight loss. . Plasma ghrelin levels (appetite or "hunger hormone") are significantly higher in OSA patients than in BMI-matched controls, but decrease to levels similar to those of obese patients without OSA after CPAP treatment.  . Weight loss improves OSA by several mechanisms, including reduction in fatty tissue in the throat (i.e. parapharyngeal fat) and the tongue. Loss of abdominal fat increases mediastinal traction on the upper airway making it less likely to collapse during sleep. . Studies have also shown that compliance with CPAP treatment improves leptin (hunger inhibitory hormone) imbalance.  6. Other depression, with emotional eating Scott Wolfe is taking Wellbutrin and says that it is helping.  Behavior modification techniques were discussed today to help Scott Wolfe deal with his emotional/non-hunger eating behaviors.  7. At risk for activity intolerance Scott Wolfe was given approximately 10 minutes of counseling today regarding his increased risk for exercise intolerance.  We discussed patient's specific personal and medical issues that raise our concern.  He was advised of strategies to prevent injury and ways to improve his cardiopulmonary fitness levels slowly over time.  We additionally discussed various fitness trackers and smart phone apps to help motivate the patient to stay on track.   8. Obesity, current BMI 38.5  Course: Scott Wolfe is currently in the action stage of change. As such, his goal is to continue with weight loss efforts.   Nutrition goals: He has agreed to the Category 4 Plan.   Exercise goals: For substantial health benefits, adults should do at least 150 minutes (2 hours and 30 minutes) a week of moderate-intensity, or 75 minutes (1 hour and 15 minutes) a week of vigorous-intensity aerobic physical activity, or an  equivalent combination of moderate- and vigorous-intensity aerobic activity. Aerobic activity should be performed in episodes of at least 10 minutes, and preferably, it should be spread throughout the week.  Behavioral modification strategies: increasing lean protein intake, decreasing simple carbohydrates, increasing vegetables, increasing water intake and emotional eating strategies.  Scott Wolfe has agreed to follow-up with our clinic in 4 weeks. He was informed of the importance of frequent follow-up visits to maximize his success with intensive lifestyle modifications for his multiple health conditions.   Objective:   Blood pressure 123/76, pulse 71, temperature 97.6 F (36.4 C), temperature source Oral, height 5\' 11"  (1.803 m), weight 275 lb (124.7 kg), SpO2 98 %. Body mass index is 38.35 kg/m.  General: Cooperative, alert, well developed, in no acute distress. HEENT: Conjunctivae and lids unremarkable. Cardiovascular: Regular rhythm.  Lungs: Normal work of breathing. Neurologic: No focal deficits.   Lab Results  Component Value Date   CREATININE 1.1 11/30/2020   BUN 25 (A) 11/30/2020   NA 139 11/30/2020   K 4.4 11/30/2020   CL 104 11/30/2020   CO2 29 (A) 11/30/2020   Lab Results  Component Value Date   ALT 44 (A) 11/30/2020   AST 26 11/30/2020   ALKPHOS 39 11/30/2020   BILITOT 1.2 05/30/2020   Lab Results  Component Value Date   HGBA1C 5.3 05/30/2020   HGBA1C 6.6 (H) 11/03/2019   Lab Results  Component Value Date   INSULIN 7.5 01/04/2020  INSULIN 125.0 (H) 11/03/2019   Lab Results  Component Value Date   TSH 1.910 11/03/2019   Lab Results  Component Value Date   CHOL 100 05/30/2020   HDL 55 05/30/2020   LDLCALC 29 05/30/2020   TRIG 75 05/30/2020   CHOLHDL 1.8 05/30/2020   Lab Results  Component Value Date   WBC 5.7 05/30/2020   HGB 17.0 05/30/2020   HCT 49.6 05/30/2020   MCV 96 05/30/2020   PLT 106 (L) 05/30/2020   Lab Results  Component Value Date    IRON 124 11/03/2019   TIBC 419 11/03/2019   FERRITIN 269 11/03/2019   Attestation Statements:   Reviewed by clinician on day of visit: allergies, medications, problem list, medical history, surgical history, family history, social history, and previous encounter notes.  Leodis Binet Friedenbach, CMA, am acting as Location manager for [employer], DO.  I have reviewed the above documentation for accuracy and completeness, and I agree with the above. Briscoe Deutscher, DO

## 2020-12-13 ENCOUNTER — Encounter (INDEPENDENT_AMBULATORY_CARE_PROVIDER_SITE_OTHER): Payer: Self-pay | Admitting: Family Medicine

## 2021-01-15 ENCOUNTER — Encounter (INDEPENDENT_AMBULATORY_CARE_PROVIDER_SITE_OTHER): Payer: Self-pay | Admitting: Family Medicine

## 2021-01-16 ENCOUNTER — Ambulatory Visit (INDEPENDENT_AMBULATORY_CARE_PROVIDER_SITE_OTHER): Payer: 59 | Admitting: Family Medicine

## 2021-01-16 ENCOUNTER — Encounter (INDEPENDENT_AMBULATORY_CARE_PROVIDER_SITE_OTHER): Payer: Self-pay | Admitting: Family Medicine

## 2021-01-16 ENCOUNTER — Other Ambulatory Visit: Payer: Self-pay

## 2021-01-16 VITALS — BP 128/77 | HR 78 | Temp 97.9°F | Ht 71.0 in | Wt 274.0 lb

## 2021-01-16 DIAGNOSIS — E1169 Type 2 diabetes mellitus with other specified complication: Secondary | ICD-10-CM

## 2021-01-16 DIAGNOSIS — Z9189 Other specified personal risk factors, not elsewhere classified: Secondary | ICD-10-CM

## 2021-01-16 DIAGNOSIS — K760 Fatty (change of) liver, not elsewhere classified: Secondary | ICD-10-CM

## 2021-01-16 DIAGNOSIS — E785 Hyperlipidemia, unspecified: Secondary | ICD-10-CM

## 2021-01-16 DIAGNOSIS — G479 Sleep disorder, unspecified: Secondary | ICD-10-CM | POA: Diagnosis not present

## 2021-01-16 DIAGNOSIS — M792 Neuralgia and neuritis, unspecified: Secondary | ICD-10-CM | POA: Diagnosis not present

## 2021-01-16 DIAGNOSIS — Z6841 Body Mass Index (BMI) 40.0 and over, adult: Secondary | ICD-10-CM

## 2021-01-16 DIAGNOSIS — F3289 Other specified depressive episodes: Secondary | ICD-10-CM

## 2021-01-16 MED ORDER — GABAPENTIN 100 MG PO CAPS
100.0000 mg | ORAL_CAPSULE | Freq: Every day | ORAL | 3 refills | Status: DC
Start: 2021-01-16 — End: 2021-05-29

## 2021-01-16 NOTE — Telephone Encounter (Signed)
Pt last seen by Dr. Wallace.  

## 2021-01-23 NOTE — Progress Notes (Signed)
Chief Complaint:   OBESITY Scott Wolfe is here to discuss his progress with his obesity treatment plan along with follow-up of his obesity related diagnoses.   Today's visit was #: 56 Starting weight: 326 lbs Starting date: 11/03/2019 Today's weight: 274 lbs Today's date: 01/16/2021 Weight change since last visit: 1 lb Total lbs lost to date: 52 lbs Body mass index is 38.22 kg/m.  Total weight loss percentage to date: -15.95%  Interim History:  Tadarrius says he has been having restless sleep.  He had stopped NSAIDs prior to bed.  Sleep medication helps.  He has OA - stiff - T/L.  He has 10 months until retirement.  He says his hunger has lessened.  He went on vacation and worked on exercise.  When he gets fatigued, more pain follows.  He endorses neuropathic pain and slight tremors.  He will come fasting for his next visit for IC and labs.  He is due for a colonoscopy.  Current Meal Plan: the Category 4 Plan for 90-95% of the time.  Current Exercise Plan: Walking/rowing for 20 minutes 6 times per week. Current Anti-Obesity Medications: Ozempic 0.5 mg subcutaneously weekly. Side effects: None.  Assessment/Plan:   Meds ordered this encounter  Medications  . gabapentin (NEURONTIN) 100 MG capsule    Sig: Take 1-2 capsules (100-200 mg total) by mouth at bedtime.    Dispense:  60 capsule    Refill:  3    1. Neuropathic pain Dajour endorses neuropathic pain in his legs. Plan:  Start gabapentin 100-200 mg at bedtime, as per below.  Okay to stop trazodone.  - Start gabapentin (NEURONTIN) 100 MG capsule; Take 1-2 capsules (100-200 mg total) by mouth at bedtime.  Dispense: 60 capsule; Refill: 3  2. NAFLD (nonalcoholic fatty liver disease) [place] in 2019.    NAFLD is an umbrella term that encompasses a disease spectrum that includes steatosis (fat) without inflammation, steatohepatitis (NASH; fat + inflammation in a characteristic pattern), and cirrhosis. Bland steatosis is felt to be a benign  condition, with extremely low to no risk of progression to cirrhosis, whereas NASH can progress to cirrhosis. The mainstay of treatment of NAFLD includes lifestyle modification to achieve weight loss, at least 7% of current body weight. Low carbohydrate diets can be beneficial in improving NAFLD liver histology. Additionally, exercise, even the absence of weight loss can have beneficial effects on the patient's metabolic profile and liver health.   3. Type 2 diabetes mellitus with other specified complication, without long-term current use of insulin (HCC) Diabetes Mellitus: Not at goal. Medication: Ozempic 0.5 mg weekly. Issues reviewed: blood sugar goals, complications of diabetes mellitus, hypoglycemia prevention and treatment, exercise, and nutrition.   Plan:  Continue Ozempic.  The patient will continue to focus on protein-rich, low simple carbohydrate foods. We reviewed the importance of hydration, regular exercise for stress reduction, and restorative sleep.   Lab Results  Component Value Date   HGBA1C 5.3 05/30/2020   HGBA1C 6.6 (H) 11/03/2019   Lab Results  Component Value Date   LDLCALC 29 05/30/2020   CREATININE 1.1 11/30/2020   4. Hyperlipidemia associated with type 2 diabetes mellitus ([place]) Course: Controlled. Lipid-lowering medications: Crestor 10 mg daily.   Plan: Continue Crestor.  Dietary changes: Increase soluble fiber, decrease simple carbohydrates, decrease saturated fat. Exercise changes: Moderate to vigorous-intensity aerobic activity 150 minutes per week or as tolerated. We will continue to monitor along with PCP/specialists as it pertains to his weight loss journey.  Lab  Results  Component Value Date   CHOL 100 05/30/2020   HDL 55 05/30/2020   LDLCALC 29 05/30/2020   TRIG 75 05/30/2020   CHOLHDL 1.8 05/30/2020   Lab Results  Component Value Date   ALT 44 (A) 11/30/2020   AST 26 11/30/2020   ALKPHOS 39 11/30/2020   BILITOT 1.2 05/30/2020   5. Restless  sleeper This is moderately controlled with medication. Current treatment: trazodone 50 mg at bedtime as needed.  Plan:  Will start gabapentin 100-200 mg at bedtime for neuropathy.  Okay to stop trazodone if needed. Recommend sleep hygiene measures including regular sleep schedule, optimal sleep environment, and relaxing presleep rituals.   6. Other depression, with emotional eating Controlled. Medication: Wellbutrin XL 300 mg daily.  Salil has a Psychiatry appointment in May.  Plan:  Continue to follow with Psychiatry.   7. At risk for heart disease Due to Maurizio's current state of health and medical condition(s), he is at a higher risk for heart disease.  This puts the patient at much greater risk to subsequently develop cardiopulmonary conditions that can significantly affect patient's quality of life in a negative manner.    At least 8 minutes were spent on counseling Esgar about these concerns today. Evidence-based interventions for health behavior change were utilized today including the discussion of self monitoring techniques, problem-solving barriers, and SMART goal setting techniques.  Specifically, regarding patient's less desirable eating habits and patterns, we employed the technique of small changes when Cullan has not been able to fully commit to his prudent nutritional plan.  8. Obesity, current BMI 38.3  Course: Massey is currently in the action stage of change. As such, his goal is to continue with weight loss efforts.   Nutrition goals: He has agreed to the Category 3 Plan.   Exercise goals: For substantial health benefits, adults should do at least 150 minutes (2 hours and 30 minutes) a week of moderate-intensity, or 75 minutes (1 hour and 15 minutes) a week of vigorous-intensity aerobic physical activity, or an equivalent combination of moderate- and vigorous-intensity aerobic activity. Aerobic activity should be performed in episodes of at least 10 minutes, and preferably, it should  be spread throughout the week.  Behavioral modification strategies: increasing lean protein intake, decreasing simple carbohydrates, increasing vegetables, increasing water intake, decreasing liquid calories and emotional eating strategies.  Miller has agreed to follow-up with our clinic in 4 weeks. He was informed of the importance of frequent follow-up visits to maximize his success with intensive lifestyle modifications for his multiple health conditions.   Objective:   Blood pressure 128/77, pulse 78, temperature 97.9 F (36.6 C), temperature source Oral, height 5\' 11"  (1.803 m), weight 274 lb (124.3 kg), SpO2 98 %. Body mass index is 38.22 kg/m.  General: Cooperative, alert, well developed, in no acute distress. HEENT: Conjunctivae and lids unremarkable. Cardiovascular: Regular rhythm.  Lungs: Normal work of breathing. Neurologic: No focal deficits.   Lab Results  Component Value Date   CREATININE 1.1 11/30/2020   BUN 25 (A) 11/30/2020   NA 139 11/30/2020   K 4.4 11/30/2020   CL 104 11/30/2020   CO2 29 (A) 11/30/2020   Lab Results  Component Value Date   ALT 44 (A) 11/30/2020   AST 26 11/30/2020   ALKPHOS 39 11/30/2020   BILITOT 1.2 05/30/2020   Lab Results  Component Value Date   HGBA1C 5.3 05/30/2020   HGBA1C 6.6 (H) 11/03/2019   Lab Results  Component Value Date  INSULIN 7.5 01/04/2020   INSULIN 125.0 (H) 11/03/2019   Lab Results  Component Value Date   TSH 1.910 11/03/2019   Lab Results  Component Value Date   CHOL 100 05/30/2020   HDL 55 05/30/2020   LDLCALC 29 05/30/2020   TRIG 75 05/30/2020   CHOLHDL 1.8 05/30/2020   Lab Results  Component Value Date   WBC 5.7 05/30/2020   HGB 17.0 05/30/2020   HCT 49.6 05/30/2020   MCV 96 05/30/2020   PLT 106 (L) 05/30/2020   Lab Results  Component Value Date   IRON 124 11/03/2019   TIBC 419 11/03/2019   FERRITIN 269 11/03/2019   Attestation Statements:   Reviewed by clinician on day of visit:  allergies, medications, problem list, medical history, surgical history, family history, social history, and previous encounter notes.  I, Water quality scientist, CMA, am acting as transcriptionist for Briscoe Deutscher, DO  I have reviewed the above documentation for accuracy and completeness, and I agree with the above. Briscoe Deutscher, DO

## 2021-02-06 ENCOUNTER — Encounter (INDEPENDENT_AMBULATORY_CARE_PROVIDER_SITE_OTHER): Payer: Self-pay | Admitting: Family Medicine

## 2021-02-06 DIAGNOSIS — E1169 Type 2 diabetes mellitus with other specified complication: Secondary | ICD-10-CM

## 2021-02-07 MED ORDER — OZEMPIC (0.25 OR 0.5 MG/DOSE) 2 MG/1.5ML ~~LOC~~ SOPN: PEN_INJECTOR | SUBCUTANEOUS | 0 refills | Status: DC

## 2021-02-07 NOTE — Telephone Encounter (Signed)
Pt last seen by Dr. Wallace.  

## 2021-02-12 ENCOUNTER — Encounter (INDEPENDENT_AMBULATORY_CARE_PROVIDER_SITE_OTHER): Payer: Self-pay | Admitting: Family Medicine

## 2021-02-13 ENCOUNTER — Ambulatory Visit (INDEPENDENT_AMBULATORY_CARE_PROVIDER_SITE_OTHER): Payer: 59 | Admitting: Family Medicine

## 2021-02-15 ENCOUNTER — Telehealth (INDEPENDENT_AMBULATORY_CARE_PROVIDER_SITE_OTHER): Payer: 59 | Admitting: Family Medicine

## 2021-02-15 ENCOUNTER — Encounter (INDEPENDENT_AMBULATORY_CARE_PROVIDER_SITE_OTHER): Payer: Self-pay

## 2021-03-13 ENCOUNTER — Other Ambulatory Visit: Payer: Self-pay

## 2021-03-13 ENCOUNTER — Encounter (INDEPENDENT_AMBULATORY_CARE_PROVIDER_SITE_OTHER): Payer: Self-pay | Admitting: Family Medicine

## 2021-03-13 ENCOUNTER — Ambulatory Visit (INDEPENDENT_AMBULATORY_CARE_PROVIDER_SITE_OTHER): Payer: 59 | Admitting: Family Medicine

## 2021-03-13 VITALS — BP 127/79 | HR 72 | Temp 97.9°F | Ht 71.0 in | Wt 282.0 lb

## 2021-03-13 DIAGNOSIS — Z6841 Body Mass Index (BMI) 40.0 and over, adult: Secondary | ICD-10-CM

## 2021-03-13 DIAGNOSIS — K760 Fatty (change of) liver, not elsewhere classified: Secondary | ICD-10-CM

## 2021-03-13 DIAGNOSIS — R0602 Shortness of breath: Secondary | ICD-10-CM

## 2021-03-13 DIAGNOSIS — E1169 Type 2 diabetes mellitus with other specified complication: Secondary | ICD-10-CM

## 2021-03-13 DIAGNOSIS — E559 Vitamin D deficiency, unspecified: Secondary | ICD-10-CM

## 2021-03-13 DIAGNOSIS — Z9189 Other specified personal risk factors, not elsewhere classified: Secondary | ICD-10-CM | POA: Diagnosis not present

## 2021-03-13 DIAGNOSIS — E785 Hyperlipidemia, unspecified: Secondary | ICD-10-CM

## 2021-03-13 MED ORDER — OZEMPIC (1 MG/DOSE) 2 MG/1.5ML ~~LOC~~ SOPN: 1.0000 mg | PEN_INJECTOR | SUBCUTANEOUS | 0 refills | Status: DC

## 2021-03-14 LAB — CBC WITH DIFFERENTIAL/PLATELET
Basophils Absolute: 0.1 10*3/uL (ref 0.0–0.2)
Basos: 1 %
EOS (ABSOLUTE): 0.2 10*3/uL (ref 0.0–0.4)
Eos: 2 %
Hematocrit: 49.5 % (ref 37.5–51.0)
Hemoglobin: 16.4 g/dL (ref 13.0–17.7)
Immature Grans (Abs): 0 10*3/uL (ref 0.0–0.1)
Immature Granulocytes: 0 %
Lymphocytes Absolute: 1.5 10*3/uL (ref 0.7–3.1)
Lymphs: 22 %
MCH: 32.5 pg (ref 26.6–33.0)
MCHC: 33.1 g/dL (ref 31.5–35.7)
MCV: 98 fL — ABNORMAL HIGH (ref 79–97)
Monocytes Absolute: 0.6 10*3/uL (ref 0.1–0.9)
Monocytes: 8 %
Neutrophils Absolute: 4.5 10*3/uL (ref 1.4–7.0)
Neutrophils: 67 %
Platelets: 126 10*3/uL — ABNORMAL LOW (ref 150–450)
RBC: 5.04 x10E6/uL (ref 4.14–5.80)
RDW: 12.4 % (ref 11.6–15.4)
WBC: 6.8 10*3/uL (ref 3.4–10.8)

## 2021-03-14 LAB — COMPREHENSIVE METABOLIC PANEL
ALT: 37 IU/L (ref 0–44)
AST: 22 IU/L (ref 0–40)
Albumin/Globulin Ratio: 2.2 (ref 1.2–2.2)
Albumin: 4.6 g/dL (ref 3.8–4.8)
Alkaline Phosphatase: 55 IU/L (ref 44–121)
BUN/Creatinine Ratio: 22 (ref 10–24)
BUN: 21 mg/dL (ref 8–27)
Bilirubin Total: 0.8 mg/dL (ref 0.0–1.2)
CO2: 24 mmol/L (ref 20–29)
Calcium: 9.4 mg/dL (ref 8.6–10.2)
Chloride: 102 mmol/L (ref 96–106)
Creatinine, Ser: 0.96 mg/dL (ref 0.76–1.27)
Globulin, Total: 2.1 g/dL (ref 1.5–4.5)
Glucose: 93 mg/dL (ref 65–99)
Potassium: 4.7 mmol/L (ref 3.5–5.2)
Sodium: 141 mmol/L (ref 134–144)
Total Protein: 6.7 g/dL (ref 6.0–8.5)
eGFR: 90 mL/min/{1.73_m2} (ref >59)

## 2021-03-14 LAB — LIPID PANEL
Chol/HDL Ratio: 2 ratio (ref 0.0–5.0)
Cholesterol, Total: 112 mg/dL (ref 100–199)
HDL: 55 mg/dL (ref >39)
LDL Chol Calc (NIH): 32 mg/dL (ref 0–99)
Triglycerides: 153 mg/dL — ABNORMAL HIGH (ref 0–149)
VLDL Cholesterol Cal: 25 mg/dL (ref 5–40)

## 2021-03-14 LAB — INSULIN, RANDOM: INSULIN: 40.9 u[IU]/mL — ABNORMAL HIGH (ref 2.6–24.9)

## 2021-03-14 LAB — HEMOGLOBIN A1C
Est. average glucose Bld gHb Est-mCnc: 108 mg/dL
Hgb A1c MFr Bld: 5.4 % (ref 4.8–5.6)

## 2021-03-14 LAB — VITAMIN D 25 HYDROXY (VIT D DEFICIENCY, FRACTURES): Vit D, 25-Hydroxy: 45.6 ng/mL (ref 30.0–100.0)

## 2021-03-23 NOTE — Progress Notes (Signed)
Chief Complaint:   OBESITY Scott Wolfe is here to discuss his progress with his obesity treatment plan along with follow-up of his obesity related diagnoses.   Today's visit was #: 36 Starting weight: 326 lbs Starting date: 11/03/2019 Today's weight: 282 lbs Today's date: 03/13/2021 Weight change since last visit: +8 lbs Total lbs lost to date: 44 lbs Body mass index is 39.33 kg/m.  Total weight loss percentage to date: -13.50%  Interim History:  Scott Wolfe says that he took himself off Vyvanse.  He wants to stop when he retires.  He says that Neurontin is helping his sleep/pain.  Ozempic is really helpful for his polyphagia, but he has been out for 2 weeks.  He had COVID again and is still fatigued and has brain fog.  Current Meal Plan: the Category 4 Plan for 75% of the time.  Current Exercise Plan: None. Current Anti-Obesity Medications: Ozempic 0.5 mg subcutaneously weekly (has missed 2 weeks). Side effects: None.  Assessment/Plan:   Orders Placed This Encounter  Procedures   CBC with Differential/Platelet   Comprehensive metabolic panel   Hemoglobin A1c   Insulin, random   Lipid panel   VITAMIN D 25 Hydroxy (Vit-D Deficiency, Fractures)   Meds ordered this encounter  Medications   Semaglutide, 1 MG/DOSE, (OZEMPIC, 1 MG/DOSE,) 2 MG/1.5ML SOPN    Sig: Inject 1 mg into the skin once a week.    Dispense:  3 mL    Refill:  0    1. Type 2 diabetes mellitus with other specified complication, without long-term current use of insulin (HCC) Diabetes Mellitus: Not at goal. Medication: Ozempic 0.5 mg subcutaneously weekly. Issues reviewed: blood sugar goals, complications of diabetes mellitus, hypoglycemia prevention and treatment, exercise, and nutrition.   Plan: The patient will continue to focus on protein-rich, low simple carbohydrate foods. We reviewed the importance of hydration, regular exercise for stress reduction, and restorative sleep.   Lab Results  Component Value Date    HGBA1C 5.4 03/13/2021   HGBA1C 5.3 05/30/2020   HGBA1C 6.6 (H) 11/03/2019   Lab Results  Component Value Date   LDLCALC 32 03/13/2021   CREATININE 0.96 03/13/2021   - CBC with Differential/Platelet - Hemoglobin A1c - Insulin, random - Refill and increase semaglutide, 1 MG/DOSE, (OZEMPIC, 1 MG/DOSE,) 2 MG/1.5ML SOPN; Inject 1 mg into the skin once a week.  Dispense: 3 mL; Refill: 0  2. NAFLD (nonalcoholic fatty liver disease) NAFLD is an umbrella term that encompasses a disease spectrum that includes steatosis (fat) without inflammation, steatohepatitis (NASH; fat + inflammation in a characteristic pattern), and cirrhosis. Bland steatosis is felt to be a benign condition, with extremely low to no risk of progression to cirrhosis, whereas NASH can progress to cirrhosis. The mainstay of treatment of NAFLD includes lifestyle modification to achieve weight loss, at least 7% of current body weight. Low carbohydrate diets can be beneficial in improving NAFLD liver histology. Additionally, exercise, even the absence of weight loss can have beneficial effects on the patient's metabolic profile and liver health.   - Comprehensive metabolic panel  3. Hyperlipidemia associated with type 2 diabetes mellitus ([place]) Course: Uncontrolled.  Lipid-lowering medications: Crestor 10 mg daily and Repatha 140 mg every 14 days.   Plan: Dietary changes: Increase soluble fiber, decrease simple carbohydrates, decrease saturated fat. Exercise changes: Moderate to vigorous-intensity aerobic activity 150 minutes per week or as tolerated. We will continue to monitor along with PCP/specialists as it pertains to his weight loss journey.  Lab  Results  Component Value Date   CHOL 112 03/13/2021   HDL 55 03/13/2021   LDLCALC 32 03/13/2021   TRIG 153 (H) 03/13/2021   CHOLHDL 2.0 03/13/2021   Lab Results  Component Value Date   ALT 37 03/13/2021   AST 22 03/13/2021   ALKPHOS 55 03/13/2021   BILITOT 0.8 03/13/2021    - Lipid panel  4. Vitamin D deficiency Improving, but not optimized.    Plan:  Will check vitamin D level today.  Lab Results  Component Value Date   VD25OH 45.6 03/13/2021   VD25OH 51.3 05/30/2020   VD25OH 58.1 01/04/2020   - VITAMIN D 25 Hydroxy (Vit-D Deficiency, Fractures)  5. SOB (shortness of breath) on exertion Will check IC today.  6. At risk for heart disease Due to Gatlin's current state of health and medical condition(s), he is at a higher risk for heart disease.  This puts the patient at much greater risk to subsequently develop cardiopulmonary conditions that can significantly affect patient's quality of life in a negative manner.    At least 8 minutes were spent on counseling Scott Wolfe about these concerns today. Evidence-based interventions for health behavior change were utilized today including the discussion of self monitoring techniques, problem-solving barriers, and SMART goal setting techniques.  Specifically, regarding patient's less desirable eating habits and patterns, we employed the technique of small changes when Scott Wolfe has not been able to fully commit to his prudent nutritional plan.  7. Obesity, current BMI 38.9  Course: [name] is currently in the action stage of change. As such, his goal is to continue with weight loss efforts.   Nutrition goals: He has agreed to the Category 4 Plan.   Exercise goals: For substantial health benefits, adults should do at least 150 minutes (2 hours and 30 minutes) a week of moderate-intensity, or 75 minutes (1 hour and 15 minutes) a week of vigorous-intensity aerobic physical activity, or an equivalent combination of moderate- and vigorous-intensity aerobic activity. Aerobic activity should be performed in episodes of at least 10 minutes, and preferably, it should be spread throughout the week.  Behavioral modification strategies: increasing lean protein intake, decreasing simple carbohydrates, increasing vegetables, and  increasing water intake.  Scott Wolfe has agreed to follow-up with our clinic in 4 weeks. He was informed of the importance of frequent follow-up visits to maximize his success with intensive lifestyle modifications for his multiple health conditions.   Objective:   Blood pressure 127/79, pulse 72, temperature 97.9 F (36.6 C), temperature source Oral, height 5\' 11"  (1.803 m), weight 282 lb (127.9 kg), SpO2 97 %. Body mass index is 39.33 kg/m.  General: Cooperative, alert, well developed, in no acute distress. HEENT: Conjunctivae and lids unremarkable. Cardiovascular: Regular rhythm.  Lungs: Normal work of breathing. Neurologic: No focal deficits.   Lab Results  Component Value Date   CREATININE 0.96 03/13/2021   BUN 21 03/13/2021   NA 141 03/13/2021   K 4.7 03/13/2021   CL 102 03/13/2021   CO2 24 03/13/2021   Lab Results  Component Value Date   ALT 37 03/13/2021   AST 22 03/13/2021   ALKPHOS 55 03/13/2021   BILITOT 0.8 03/13/2021   Lab Results  Component Value Date   HGBA1C 5.4 03/13/2021   HGBA1C 5.3 05/30/2020   HGBA1C 6.6 (H) 11/03/2019   Lab Results  Component Value Date   INSULIN 40.9 (H) 03/13/2021   INSULIN 7.5 01/04/2020   INSULIN 125.0 (H) 11/03/2019   Lab Results  Component  Value Date   TSH 1.910 11/03/2019   Lab Results  Component Value Date   CHOL 112 03/13/2021   HDL 55 03/13/2021   LDLCALC 32 03/13/2021   TRIG 153 (H) 03/13/2021   CHOLHDL 2.0 03/13/2021   Lab Results  Component Value Date   VD25OH 45.6 03/13/2021   VD25OH 51.3 05/30/2020   VD25OH 58.1 01/04/2020   Lab Results  Component Value Date   WBC 6.8 03/13/2021   HGB 16.4 03/13/2021   HCT 49.5 03/13/2021   MCV 98 (H) 03/13/2021   PLT 126 (L) 03/13/2021   Lab Results  Component Value Date   IRON 124 11/03/2019   TIBC 419 11/03/2019   FERRITIN 269 11/03/2019   Attestation Statements:   Reviewed by clinician on day of visit: allergies, medications, problem list, medical  history, surgical history, family history, social history, and previous encounter notes.  I, Water quality scientist, CMA, am acting as transcriptionist for Briscoe Deutscher, DO  I have reviewed the above documentation for accuracy and completeness, and I agree with the above. Briscoe Deutscher, DO

## 2021-04-10 ENCOUNTER — Encounter (INDEPENDENT_AMBULATORY_CARE_PROVIDER_SITE_OTHER): Payer: Self-pay | Admitting: Family Medicine

## 2021-04-10 ENCOUNTER — Ambulatory Visit (INDEPENDENT_AMBULATORY_CARE_PROVIDER_SITE_OTHER): Payer: 59 | Admitting: Family Medicine

## 2021-04-10 ENCOUNTER — Other Ambulatory Visit: Payer: Self-pay

## 2021-04-10 VITALS — BP 143/83 | HR 80 | Temp 98.1°F | Ht 71.0 in | Wt 280.0 lb

## 2021-04-10 DIAGNOSIS — R5381 Other malaise: Secondary | ICD-10-CM

## 2021-04-10 DIAGNOSIS — Z9189 Other specified personal risk factors, not elsewhere classified: Secondary | ICD-10-CM

## 2021-04-10 DIAGNOSIS — Z6841 Body Mass Index (BMI) 40.0 and over, adult: Secondary | ICD-10-CM

## 2021-04-10 DIAGNOSIS — E1169 Type 2 diabetes mellitus with other specified complication: Secondary | ICD-10-CM

## 2021-04-10 DIAGNOSIS — R5383 Other fatigue: Secondary | ICD-10-CM

## 2021-04-10 DIAGNOSIS — D696 Thrombocytopenia, unspecified: Secondary | ICD-10-CM

## 2021-04-11 MED ORDER — OZEMPIC (2 MG/DOSE) 8 MG/3ML ~~LOC~~ SOPN: 2.0000 mg | PEN_INJECTOR | SUBCUTANEOUS | 0 refills | Status: DC

## 2021-04-12 ENCOUNTER — Telehealth: Payer: Self-pay | Admitting: Nurse Practitioner

## 2021-04-12 ENCOUNTER — Other Ambulatory Visit: Payer: Self-pay | Admitting: *Deleted

## 2021-04-12 DIAGNOSIS — D696 Thrombocytopenia, unspecified: Secondary | ICD-10-CM

## 2021-04-12 NOTE — Telephone Encounter (Signed)
I called and left a detailed message for patient regarding his NP appointment.  I did ask that he return our call to confirm this appointment as well.  Appointment Calendar was mailed.

## 2021-04-12 NOTE — Progress Notes (Signed)
Chief Complaint:   OBESITY Duran is here to discuss his progress with his obesity treatment plan along with follow-up of his obesity related diagnoses.   Today's visit was #: 2 Starting weight: 326 lbs Starting date: 11/03/2019 Today's weight: 280 lbs Today's date: 04/10/2021 Weight change since last visit: 2 lbs Total lbs lost to date: 46 lbs Body mass index is 39.05 kg/m.  Total weight loss percentage to date: -14.11%  Interim History:  Alaric says he has increased hunger toward the end of the week.  His home blood pressure runs 108-110 / low 80s, pulse 70s.  Current Meal Plan: the Category 4 Plan for 90% of the time.  Current Exercise Plan: None. Current Anti-Obesity Medications: Ozempic 1 mg subcutaneously weekly. Side effects: None.  Assessment/Plan:   1. Type 2 diabetes mellitus with other specified complication, without long-term current use of insulin (HCC) Diabetes Mellitus: At goal. Medication: Ozempic 1 mg subcutaneously weekly.   Plan:  Will increase Ozempic to 2 mg subcutaneously weekly.  The patient will continue to focus on protein-rich, low simple carbohydrate foods. We reviewed the importance of hydration, regular exercise for stress reduction, and restorative sleep.   Lab Results  Component Value Date   HGBA1C 5.4 03/13/2021   HGBA1C 5.3 05/30/2020   HGBA1C 6.6 (H) 11/03/2019   Lab Results  Component Value Date   LDLCALC 32 03/13/2021   CREATININE 0.96 03/13/2021   - Increase and refill Semaglutide, 2 MG/DOSE, (OZEMPIC, 2 MG/DOSE,) 8 MG/3ML SOPN; Inject 2 mg into the skin once a week.  Dispense: 3 mL; Refill: 0  2. Thrombocytopenia ([place]) He has had no history of workup for this.  CBC Latest Ref Rng & Units 03/13/2021 05/30/2020 11/03/2019  WBC 3.4 - 10.8 x10E3/uL 6.8 5.7 6.7  Hemoglobin 13.0 - 17.7 g/dL 16.4 17.0 17.4  Hematocrit 37.5 - 51.0 % 49.5 49.6 48.9  Platelets 150 - 450 x10E3/uL 126(L) 106(L) 134(L)   Plan:  Will place referral to Hematology  for evaluation.  - Ambulatory referral to Hematology / Oncology  3. Malaise and fatigue This has continued since he had COVID.  He says it is slightly worse.  He is no longer taking Vyvanse.  Plan:  Will monitor.  4. At risk for heart disease Due to Keigan's current state of health and medical condition(s), he is at a higher risk for heart disease.  This puts the patient at much greater risk to subsequently develop cardiopulmonary conditions that can significantly affect patient's quality of life in a negative manner.    At least 8 minutes were spent on counseling Eshan about these concerns today. Evidence-based interventions for health behavior change were utilized today including the discussion of self monitoring techniques, problem-solving barriers, and SMART goal setting techniques.  Specifically, regarding patient's less desirable eating habits and patterns, we employed the technique of small changes when Costantino has not been able to fully commit to his prudent nutritional plan.  5. Obesity, current BMI 39.1  Course: Benhard is currently in the action stage of change. As such, his goal is to continue with weight loss efforts.   Nutrition goals: He has agreed to the Category 4 Plan.   Exercise goals: All adults should avoid inactivity. Some physical activity is better than none, and adults who participate in any amount of physical activity gain some health benefits.  Behavioral modification strategies: increasing lean protein intake, decreasing simple carbohydrates, increasing vegetables, and increasing water intake.  Laderrick has agreed to follow-up  with our clinic in 4 weeks. He was informed of the importance of frequent follow-up visits to maximize his success with intensive lifestyle modifications for his multiple health conditions.   Objective:   Blood pressure (!) 143/83, pulse 80, temperature 98.1 F (36.7 C), temperature source Oral, height '5\' 11"'$  (1.803 m), weight 280 lb (127 kg), SpO2  97 %. Body mass index is 39.05 kg/m.  General: Cooperative, alert, well developed, in no acute distress. HEENT: Conjunctivae and lids unremarkable. Cardiovascular: Regular rhythm.  Lungs: Normal work of breathing. Neurologic: No focal deficits.   Lab Results  Component Value Date   CREATININE 0.96 03/13/2021   BUN 21 03/13/2021   NA 141 03/13/2021   K 4.7 03/13/2021   CL 102 03/13/2021   CO2 24 03/13/2021   Lab Results  Component Value Date   ALT 37 03/13/2021   AST 22 03/13/2021   ALKPHOS 55 03/13/2021   BILITOT 0.8 03/13/2021   Lab Results  Component Value Date   HGBA1C 5.4 03/13/2021   HGBA1C 5.3 05/30/2020   HGBA1C 6.6 (H) 11/03/2019   Lab Results  Component Value Date   INSULIN 40.9 (H) 03/13/2021   INSULIN 7.5 01/04/2020   INSULIN 125.0 (H) 11/03/2019   Lab Results  Component Value Date   TSH 1.910 11/03/2019   Lab Results  Component Value Date   CHOL 112 03/13/2021   HDL 55 03/13/2021   LDLCALC 32 03/13/2021   TRIG 153 (H) 03/13/2021   CHOLHDL 2.0 03/13/2021   Lab Results  Component Value Date   VD25OH 45.6 03/13/2021   VD25OH 51.3 05/30/2020   VD25OH 58.1 01/04/2020   Lab Results  Component Value Date   WBC 6.8 03/13/2021   HGB 16.4 03/13/2021   HCT 49.5 03/13/2021   MCV 98 (H) 03/13/2021   PLT 126 (L) 03/13/2021   Lab Results  Component Value Date   IRON 124 11/03/2019   TIBC 419 11/03/2019   FERRITIN 269 11/03/2019   Attestation Statements:   Reviewed by clinician on day of visit: allergies, medications, problem list, medical history, surgical history, family history, social history, and previous encounter notes.  I, Water quality scientist, CMA, am acting as transcriptionist for Briscoe Deutscher, DO  I have reviewed the above documentation for accuracy and completeness, and I agree with the above. Briscoe Deutscher, DO

## 2021-04-24 ENCOUNTER — Encounter (INDEPENDENT_AMBULATORY_CARE_PROVIDER_SITE_OTHER): Payer: Self-pay | Admitting: Family Medicine

## 2021-04-24 DIAGNOSIS — E1169 Type 2 diabetes mellitus with other specified complication: Secondary | ICD-10-CM

## 2021-04-24 NOTE — Telephone Encounter (Signed)
Pt last seen by Dr. Wallace.  

## 2021-04-26 MED ORDER — TIRZEPATIDE 7.5 MG/0.5ML ~~LOC~~ SOAJ: 7.5000 mg | SUBCUTANEOUS | 0 refills | Status: DC

## 2021-05-01 ENCOUNTER — Other Ambulatory Visit: Payer: Self-pay

## 2021-05-01 ENCOUNTER — Inpatient Hospital Stay: Payer: 59 | Attending: Oncology

## 2021-05-01 DIAGNOSIS — D696 Thrombocytopenia, unspecified: Secondary | ICD-10-CM | POA: Diagnosis present

## 2021-05-01 LAB — CBC WITH DIFFERENTIAL (CANCER CENTER ONLY)
Abs Immature Granulocytes: 0.03 10*3/uL (ref 0.00–0.07)
Basophils Absolute: 0.1 10*3/uL (ref 0.0–0.1)
Basophils Relative: 1 %
Eosinophils Absolute: 0.1 10*3/uL (ref 0.0–0.5)
Eosinophils Relative: 2 %
HCT: 46.8 % (ref 39.0–52.0)
Hemoglobin: 16.6 g/dL (ref 13.0–17.0)
Immature Granulocytes: 1 %
Lymphocytes Relative: 23 %
Lymphs Abs: 1.5 10*3/uL (ref 0.7–4.0)
MCH: 33.7 pg (ref 26.0–34.0)
MCHC: 35.5 g/dL (ref 30.0–36.0)
MCV: 94.9 fL (ref 80.0–100.0)
Monocytes Absolute: 0.7 10*3/uL (ref 0.1–1.0)
Monocytes Relative: 10 %
Neutro Abs: 4 10*3/uL (ref 1.7–7.7)
Neutrophils Relative %: 63 %
Platelet Count: 131 10*3/uL — ABNORMAL LOW (ref 150–400)
RBC: 4.93 MIL/uL (ref 4.22–5.81)
RDW: 12.6 % (ref 11.5–15.5)
WBC Count: 6.5 10*3/uL (ref 4.0–10.5)
nRBC: 0 % (ref 0.0–0.2)

## 2021-05-01 LAB — SAVE SMEAR(SSMR), FOR PROVIDER SLIDE REVIEW

## 2021-05-02 ENCOUNTER — Encounter: Payer: Self-pay | Admitting: *Deleted

## 2021-05-02 NOTE — Progress Notes (Addendum)
New Hematology/Oncology Consult   Requesting MD: Dr. Briscoe Deutscher  [phone]  Reason for Consult: Thrombocytopenia  HPI: Scott Wolfe is a 60 year old man referred for evaluation of thrombocytopenia.  Labs from 03/13/2021 returned with a platelet count of 126,000.  Review of labs through the EMR show chronic mild thrombocytopenia dating to 01/13/2016 at which time the platelet count was 145,000.  Labs done in our office 05/01/2021 returned with a platelet count of 131,000, normal hemoglobin and white count.  Past Medical History:  Diagnosis Date   ADHD (attention deficit hyperactivity disorder)    Borderline diabetes    COVID-19 virus infection    January 2021, contd sxs, Taste/smell, fogginess,headache, nausea,tremor,    Dyspnea    Elevated liver enzymes    Hypercholesterolemia    Hypertension    Kidney stone    Lower extremity edema    Obesity    OCD (obsessive compulsive disorder)    Prediabetes    Renal disorder    Sleep apnea    uses CPAP nightly  :   Past Surgical History:  Procedure Laterality Date   HERNIA REPAIR     KNEE ARTHROSCOPY WITH MEDIAL MENISECTOMY Left 09/10/2014   Procedure: LEFT KNEE ARTHROSCOPY WITH DEBRIDEMENT AND  MEDIAL MENISECTOMY;  Surgeon: Renette Butters, MD;  Location: [facility];  Service: Orthopedics;  Laterality: Left;  :   Current Outpatient Medications:    aspirin 81 MG tablet, Take 1 tablet (81 mg total) by mouth daily., Disp: 30 tablet, Rfl: 0   buPROPion (WELLBUTRIN XL) 300 MG 24 hr tablet, Take 1 tablet (300 mg total) by mouth daily., Disp: 90 tablet, Rfl: 3   cetirizine (ZYRTEC) 10 MG tablet, Take 10 mg by mouth daily as needed for allergies., Disp: , Rfl:    gabapentin (NEURONTIN) 100 MG capsule, Take 1-2 capsules (100-200 mg total) by mouth at bedtime., Disp: 60 capsule, Rfl: 3   Glucosamine-Chondroitin (OSTEO BI-FLEX REGULAR STRENGTH PO), Take 1 tablet by mouth daily., Disp: , Rfl:    indapamide (LOZOL) 1.25 MG  tablet, Take 1.25 mg by mouth daily., Disp: , Rfl:    lisinopril (ZESTRIL) 20 MG tablet, Take 20 mg by mouth daily. , Disp: , Rfl:    Multiple Vitamin (MULTIVITAMIN WITH MINERALS) TABS tablet, Take 1 tablet by mouth daily., Disp: , Rfl:    REPATHA SURECLICK [id number] MG/ML SOAJ, INJECT 1 PEN INTO THE SKIN EVERY 14 DAYS, Disp: 2 mL, Rfl: 11   rosuvastatin (CRESTOR) 10 MG tablet, TAKE 1 TABLET(10 MG) BY MOUTH DAILY, Disp: 90 tablet, Rfl: 2   tirzepatide (MOUNJARO) 7.5 MG/0.5ML Pen, Inject 7.5 mg into the skin once a week., Disp: 2 mL, Rfl: 0:   No Known Allergies  FH: No family history of a hematological disorder.  SOCIAL HISTORY: He lives in [place].  He is divorced.  He has 3 daughters are reported to be in good health.  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  No tobacco use.  He occasionally drinks a beer.  He has never been a heavy drinker.  Review of Systems: He denies bleeding.  He reports intentional 50 pound weight loss over the past 1+ year.  He reports having "long COVID", main symptom fatigue.  He thinks the initial COVID diagnosis was January 2021.  He has a good appetite.  No fevers or sweats.  He has intermittent right flank pain which he attributes to kidney stones.  No cough or shortness of  breath.  No change in bowel habits.  No bloody or black stools.  He states he has a "fatty liver".  Nocturia.  He reports having a "lipoma" at the right chest/breast, present approximately 4 years.  Physical Exam:  Blood pressure (!) 150/99, pulse 78, temperature 97.8 F (36.6 C), temperature source Oral, resp. rate 18, height '5\' 11"'$  (1.803 m), weight 295 lb (133.8 kg), SpO2 99 %.  HEENT: Sclera anicteric.  No thrush or ulcers. Lungs: Lungs clear bilaterally. Cardiac: Regular rate and rhythm. Abdomen: Abdomen soft and nontender.  No hepatosplenomegaly. Vascular: No leg edema. Lymph nodes: No palpable cervical, supraclavicular or inguinal lymph nodes.  Prominent  bilateral axillary fat pad right greater than left versus soft mobile lymph nodes. Neurologic: Alert and oriented. Skin: No rash. Breast: Approximate 3 cm area of mobile firmness 1 to 2 o'clock position right breast 8-10 cm from the aerola. [facility] LABS:   Recent Labs    05/01/21 0913  WBC 6.5  HGB 16.6  HCT 46.8  PLT 131*   Peripheral blood smear: #1 platelets-mildly decreased in number, normal morphology, very few small platelet clumps; #2 Red blood cells-RBC morphology unremarkable; #3 white blood cells-morphology unremarkable.  RADIOLOGY:  No results found.  Assessment and Plan:   Thrombocytopenia, chronic mild Area of firm fullness right breast upper inner quadrant Obesity Hypertension Sleep apnea  Scott Wolfe is referred for evaluation of thrombocytopenia.  Dr. Benay Spice reviewed potential etiologies with him including benign normal variant, ITP, platelet clumping, low-grade lymphoma/leukemia.  The thrombocytopenia has been present since at least 2017 and is largely unchanged during this time.  The remainder of the CBC is unremarkable.  Review of the peripheral blood smear by Dr. Benay Spice shows a mild decrease in the platelet count, otherwise no significant findings.  He will return for a repeat CBC in 6 months.  He understands to contact the office with bleeding.  He has an area of firm fullness right breast upper inner quadrant.  Per his report this is a "lipoma" and is unchanged over 4 years.  He understands to contact the office with any increase/change.  He will return for lab and follow-up in 6 months.  We are available to see him sooner if needed.  Patient seen with Dr. Benay Spice.  Ned Card, NP 05/03/2021, 11:54 AM   This was a shared visit with Ned Card.  Scott Wolfe was interviewed and examined.  I reviewed the peripheral blood smear.  He has chronic mild thrombocytopenia.  We discussed the differential diagnosis with Scott Wolfe.  The thrombocytopenia is most likely  a benign normal variant.  There is no clinical evidence of underlying liver disease.  I doubt the small platelet clumps are responsible for the low measured platelet count.  I have a low clinical suspicion for a primary hematologic condition such as a hematopoietic malignancy or myelodysplasia.  He could have mild chronic ITP.  The area of fullness in the upper right breast is most likely a lipoma.  I was present for greater than 50% of today's visit.  I performed medical decision making.  Julieanne Manson, MD

## 2021-05-03 ENCOUNTER — Other Ambulatory Visit: Payer: Self-pay

## 2021-05-03 ENCOUNTER — Encounter: Payer: Self-pay | Admitting: Nurse Practitioner

## 2021-05-03 ENCOUNTER — Inpatient Hospital Stay: Payer: 59 | Admitting: Nurse Practitioner

## 2021-05-03 ENCOUNTER — Ambulatory Visit: Payer: 59 | Admitting: Nurse Practitioner

## 2021-05-03 VITALS — BP 150/99 | HR 78 | Temp 97.8°F | Resp 18 | Ht 71.0 in | Wt 295.0 lb

## 2021-05-03 DIAGNOSIS — D696 Thrombocytopenia, unspecified: Secondary | ICD-10-CM

## 2021-05-03 DIAGNOSIS — R2233 Localized swelling, mass and lump, upper limb, bilateral: Secondary | ICD-10-CM | POA: Diagnosis not present

## 2021-05-03 DIAGNOSIS — I10 Essential (primary) hypertension: Secondary | ICD-10-CM | POA: Diagnosis not present

## 2021-05-03 DIAGNOSIS — E669 Obesity, unspecified: Secondary | ICD-10-CM | POA: Diagnosis not present

## 2021-05-03 DIAGNOSIS — G4730 Sleep apnea, unspecified: Secondary | ICD-10-CM

## 2021-05-11 ENCOUNTER — Encounter (INDEPENDENT_AMBULATORY_CARE_PROVIDER_SITE_OTHER): Payer: Self-pay | Admitting: Family Medicine

## 2021-05-11 ENCOUNTER — Other Ambulatory Visit: Payer: Self-pay | Admitting: Family Medicine

## 2021-05-11 ENCOUNTER — Other Ambulatory Visit: Payer: Self-pay

## 2021-05-11 ENCOUNTER — Ambulatory Visit (INDEPENDENT_AMBULATORY_CARE_PROVIDER_SITE_OTHER): Payer: 59 | Admitting: Family Medicine

## 2021-05-11 VITALS — BP 132/83 | HR 68 | Temp 97.7°F | Ht 71.0 in | Wt 283.0 lb

## 2021-05-11 DIAGNOSIS — R911 Solitary pulmonary nodule: Secondary | ICD-10-CM

## 2021-05-11 DIAGNOSIS — E1169 Type 2 diabetes mellitus with other specified complication: Secondary | ICD-10-CM

## 2021-05-11 DIAGNOSIS — G4733 Obstructive sleep apnea (adult) (pediatric): Secondary | ICD-10-CM | POA: Diagnosis not present

## 2021-05-11 DIAGNOSIS — F3289 Other specified depressive episodes: Secondary | ICD-10-CM

## 2021-05-11 DIAGNOSIS — Z6841 Body Mass Index (BMI) 40.0 and over, adult: Secondary | ICD-10-CM

## 2021-05-11 DIAGNOSIS — Z9189 Other specified personal risk factors, not elsewhere classified: Secondary | ICD-10-CM | POA: Diagnosis not present

## 2021-05-11 DIAGNOSIS — E66813 Obesity, class 3: Secondary | ICD-10-CM

## 2021-05-11 DIAGNOSIS — Z9989 Dependence on other enabling machines and devices: Secondary | ICD-10-CM

## 2021-05-11 MED ORDER — BUPROPION HCL ER (XL) 300 MG PO TB24: 300.0000 mg | ORAL_TABLET | Freq: Every day | ORAL | 3 refills | Status: DC

## 2021-05-11 MED ORDER — TIRZEPATIDE 7.5 MG/0.5ML ~~LOC~~ SOAJ
7.5000 mg | SUBCUTANEOUS | 0 refills | Status: DC
Start: 2021-05-11 — End: 2021-06-15

## 2021-05-15 NOTE — Progress Notes (Signed)
Chief Complaint:   OBESITY Scott Wolfe is here to discuss his progress with his obesity treatment plan along with follow-up of his obesity related diagnoses.   Today's visit was #: 20 Starting weight: 326 lbs Starting date: 11/03/2019 Today's weight: 283 lbs Today's date: 05/11/2021 Weight change since last visit: +3 lbs Total lbs lost to date: 43 lbs Body mass index is 39.47 kg/m.  Total weight loss percentage to date: -13.19%  Current Meal Plan: the Category 4 Plan for 75% of the time.  Current Exercise Plan: Walking for 15-20 minutes 5-6 times per week. Current Anti-Obesity Medications: Mounjaro 7.5 mg subcutaneously weekly. Side effects: None.  Interim History:  Manases says he has had increased work stress recently.  He is 6 months away from retirement.  Assessment/Plan:   1. Type 2 diabetes mellitus with other specified complication, without long-term current use of insulin (HCC) Diabetes Mellitus: At goal. Medication: Mounjaro 7.5 mg subcutaneously weekly.   Plan:  Refill Mounjaro 7.5 mg subcutaneously weekly.  The patient will continue to focus on protein-rich, low simple carbohydrate foods. We reviewed the importance of hydration, regular exercise for stress reduction, and restorative sleep.   Lab Results  Component Value Date   HGBA1C 5.4 03/13/2021   HGBA1C 5.3 05/30/2020   HGBA1C 6.6 (H) 11/03/2019   Lab Results  Component Value Date   LDLCALC 32 03/13/2021   CREATININE 0.96 03/13/2021   - Refill tirzepatide (MOUNJARO) 7.5 MG/0.5ML Pen; Inject 7.5 mg into the skin once a week.  Dispense: 2 mL; Refill: 0  2. OSA on CPAP OSA is a cause of systemic hypertension and is associated with an increased incidence of stroke, heart failure, atrial fibrillation, and coronary heart disease. Severe OSA increases all-cause mortality and cardiovascular mortality.   Goal: Treatment of OSA via CPAP compliance and weight loss. Plasma ghrelin levels (appetite or "hunger hormone") are  significantly higher in OSA patients than in BMI-matched controls, but decrease to levels similar to those of obese patients without OSA after CPAP treatment.  Weight loss improves OSA by several mechanisms, including reduction in fatty tissue in the throat (i.e. parapharyngeal fat) and the tongue. Loss of abdominal fat increases mediastinal traction on the upper airway making it less likely to collapse during sleep. Studies have also shown that compliance with CPAP treatment improves leptin (hunger inhibitory hormone) imbalance.  3. Other depression, with emotional eating Controlled. Medication: Wellbutrin XL 300 mg daily.  Plan:  Will refill Wellbutrin XL 300 mg daily.  Discussed cues and consequences, how thoughts affect eating, model of thoughts, feelings, and behaviors, and strategies for change by focusing on the cue. Discussed cognitive distortions, coping thoughts, and how to change your thoughts.  - Refill buPROPion (WELLBUTRIN XL) 300 MG 24 hr tablet; Take 1 tablet (300 mg total) by mouth daily.  Dispense: 90 tablet; Refill: 3  4. At risk for heart disease Due to Izan's current state of health and medical condition(s), he is at a higher risk for heart disease.  This puts the patient at much greater risk to subsequently develop cardiopulmonary conditions that can significantly affect patient's quality of life in a negative manner.    At least 8 minutes were spent on counseling Nyjah about these concerns today. Evidence-based interventions for health behavior change were utilized today including the discussion of self monitoring techniques, problem-solving barriers, and SMART goal setting techniques.  Specifically, regarding patient's less desirable eating habits and patterns, we employed the technique of small changes when [employer]  has not been able to fully commit to his prudent nutritional plan.  5. Obesity BMI today 39.5  Course: General is currently in the action stage of change. As such, his  goal is to continue with weight loss efforts.   Nutrition goals: He has agreed to the Category 4 Plan.   Exercise goals: For substantial health benefits, adults should do at least 150 minutes (2 hours and 30 minutes) a week of moderate-intensity, or 75 minutes (1 hour and 15 minutes) a week of vigorous-intensity aerobic physical activity, or an equivalent combination of moderate- and vigorous-intensity aerobic activity. Aerobic activity should be performed in episodes of at least 10 minutes, and preferably, it should be spread throughout the week.  Behavioral modification strategies: increasing lean protein intake, decreasing simple carbohydrates, and increasing vegetables.  Wissam has agreed to follow-up with our clinic in 4 weeks. He was informed of the importance of frequent follow-up visits to maximize his success with intensive lifestyle modifications for his multiple health conditions.   Objective:   Blood pressure 132/83, pulse 68, temperature 97.7 F (36.5 C), temperature source Oral, height '5\' 11"'$  (1.803 m), weight 283 lb (128.4 kg), SpO2 99 %. Body mass index is 39.47 kg/m.  General: Cooperative, alert, well developed, in no acute distress. HEENT: Conjunctivae and lids unremarkable. Cardiovascular: Regular rhythm.  Lungs: Normal work of breathing. Neurologic: No focal deficits.   Lab Results  Component Value Date   CREATININE 0.96 03/13/2021   BUN 21 03/13/2021   NA 141 03/13/2021   K 4.7 03/13/2021   CL 102 03/13/2021   CO2 24 03/13/2021   Lab Results  Component Value Date   ALT 37 03/13/2021   AST 22 03/13/2021   ALKPHOS 55 03/13/2021   BILITOT 0.8 03/13/2021   Lab Results  Component Value Date   HGBA1C 5.4 03/13/2021   HGBA1C 5.3 05/30/2020   HGBA1C 6.6 (H) 11/03/2019   Lab Results  Component Value Date   INSULIN 40.9 (H) 03/13/2021   INSULIN 7.5 01/04/2020   INSULIN 125.0 (H) 11/03/2019   Lab Results  Component Value Date   TSH 1.910 11/03/2019   Lab  Results  Component Value Date   CHOL 112 03/13/2021   HDL 55 03/13/2021   LDLCALC 32 03/13/2021   TRIG 153 (H) 03/13/2021   CHOLHDL 2.0 03/13/2021   Lab Results  Component Value Date   VD25OH 45.6 03/13/2021   VD25OH 51.3 05/30/2020   VD25OH 58.1 01/04/2020   Lab Results  Component Value Date   WBC 6.5 05/01/2021   HGB 16.6 05/01/2021   HCT 46.8 05/01/2021   MCV 94.9 05/01/2021   PLT 131 (L) 05/01/2021   Lab Results  Component Value Date   IRON 124 11/03/2019   TIBC 419 11/03/2019   FERRITIN 269 11/03/2019   Attestation Statements:   Reviewed by clinician on day of visit: allergies, medications, problem list, medical history, surgical history, family history, social history, and previous encounter notes.  I, Water quality scientist, CMA, am acting as transcriptionist for Briscoe Deutscher, DO  I have reviewed the above documentation for accuracy and completeness, and I agree with the above. Briscoe Deutscher, DO

## 2021-05-27 ENCOUNTER — Encounter (INDEPENDENT_AMBULATORY_CARE_PROVIDER_SITE_OTHER): Payer: Self-pay | Admitting: Family Medicine

## 2021-05-27 DIAGNOSIS — M792 Neuralgia and neuritis, unspecified: Secondary | ICD-10-CM

## 2021-05-29 ENCOUNTER — Ambulatory Visit
Admission: RE | Admit: 2021-05-29 | Discharge: 2021-05-29 | Disposition: A | Discharge status: Home / Self Care | Payer: 59 | Source: Ambulatory Visit | Attending: Family Medicine | Admitting: Family Medicine

## 2021-05-29 DIAGNOSIS — R911 Solitary pulmonary nodule: Secondary | ICD-10-CM

## 2021-05-29 MED ORDER — GABAPENTIN 100 MG PO CAPS: 100.0000 mg | ORAL_CAPSULE | Freq: Every day | ORAL | 3 refills | Status: DC

## 2021-05-29 NOTE — Telephone Encounter (Signed)
Pt last seen by Dr. Wallace.  

## 2021-05-31 ENCOUNTER — Encounter (INDEPENDENT_AMBULATORY_CARE_PROVIDER_SITE_OTHER): Payer: Self-pay

## 2021-06-14 ENCOUNTER — Encounter (INDEPENDENT_AMBULATORY_CARE_PROVIDER_SITE_OTHER): Payer: Self-pay

## 2021-06-15 ENCOUNTER — Other Ambulatory Visit: Payer: Self-pay

## 2021-06-15 ENCOUNTER — Encounter (INDEPENDENT_AMBULATORY_CARE_PROVIDER_SITE_OTHER): Payer: Self-pay | Admitting: Family Medicine

## 2021-06-15 ENCOUNTER — Ambulatory Visit (INDEPENDENT_AMBULATORY_CARE_PROVIDER_SITE_OTHER): Payer: 59 | Admitting: Family Medicine

## 2021-06-15 VITALS — BP 120/73 | HR 67 | Temp 97.9°F | Ht 71.0 in | Wt 276.0 lb

## 2021-06-15 DIAGNOSIS — E1169 Type 2 diabetes mellitus with other specified complication: Secondary | ICD-10-CM

## 2021-06-15 DIAGNOSIS — F3289 Other specified depressive episodes: Secondary | ICD-10-CM | POA: Diagnosis not present

## 2021-06-15 DIAGNOSIS — I152 Hypertension secondary to endocrine disorders: Secondary | ICD-10-CM

## 2021-06-15 DIAGNOSIS — Z6841 Body Mass Index (BMI) 40.0 and over, adult: Secondary | ICD-10-CM

## 2021-06-15 DIAGNOSIS — E785 Hyperlipidemia, unspecified: Secondary | ICD-10-CM

## 2021-06-15 DIAGNOSIS — E1159 Type 2 diabetes mellitus with other circulatory complications: Secondary | ICD-10-CM | POA: Diagnosis not present

## 2021-06-15 MED ORDER — TIRZEPATIDE 7.5 MG/0.5ML ~~LOC~~ SOAJ: 7.5000 mg | SUBCUTANEOUS | 0 refills | Status: DC

## 2021-06-15 MED ORDER — BUPROPION HCL ER (XL) 150 MG PO TB24: 150.0000 mg | ORAL_TABLET | Freq: Every day | ORAL | 1 refills | Status: DC

## 2021-06-19 ENCOUNTER — Encounter (INDEPENDENT_AMBULATORY_CARE_PROVIDER_SITE_OTHER): Payer: Self-pay

## 2021-06-20 NOTE — Progress Notes (Signed)
Chief Complaint:   OBESITY [name] is here to discuss his progress with his obesity treatment plan along with follow-up of his obesity related diagnoses. See Medical Weight Management Flowsheet for complete bioelectrical impedance results.  Today's visit was #: 21 Starting weight: 326 lbs Starting date: 11/03/2019 Weight change since last visit: 7 lbs Total lbs lost to date: 50 lbs Total weight loss percentage to date: -15.34%  Nutrition Plan: Category 4 Meal Plan for 90% of the time. Activity: Walking/cardio for 20 minutes 4 times per week. Anti-obesity medications: Mounjaro 7.5 mg subcutaneously weekly. Reported side effects: None.  Interim History: Scott Wolfe is doing very well.  He is inching toward retirement.  He says he feels happier than he has been in a long time.  He says he wants to wean off Wellbutrin.  Assessment/Plan:   1. Type 2 diabetes mellitus with other specified complication, without long-term current use of insulin (HCC) Diabetes Mellitus: At goal. Medication: Mounjaro 7.5 mg subcutaneously weekly.   Plan:  Continue Mounjaro 7.5 mg subcutaneously weekly.  Will refill today, as per below. The patient will continue to focus on protein-rich, low simple carbohydrate foods. We reviewed the importance of hydration, regular exercise for stress reduction, and restorative sleep.   Lab Results  Component Value Date   HGBA1C 5.4 03/13/2021   HGBA1C 5.3 05/30/2020   HGBA1C 6.6 (H) 11/03/2019   Lab Results  Component Value Date   LDLCALC 32 03/13/2021   CREATININE 0.96 03/13/2021   - Refill tirzepatide (MOUNJARO) 7.5 MG/0.5ML Pen; Inject 7.5 mg into the skin once a week.  Dispense: 2 mL; Refill: 0  2. Hyperlipidemia associated with type 2 diabetes mellitus ([place]) Course: Not optimized. Lipid-lowering medications: Crestor 10 mg daily, Repatha 140 mg every 14 days.   Plan: Dietary changes: Increase soluble fiber, decrease simple carbohydrates, decrease saturated fat.  Exercise changes: Moderate to vigorous-intensity aerobic activity 150 minutes per week or as tolerated. We will continue to monitor along with PCP/specialists as it pertains to his weight loss journey.  Lab Results  Component Value Date   CHOL 112 03/13/2021   HDL 55 03/13/2021   LDLCALC 32 03/13/2021   TRIG 153 (H) 03/13/2021   CHOLHDL 2.0 03/13/2021   Lab Results  Component Value Date   ALT 37 03/13/2021   AST 22 03/13/2021   ALKPHOS 55 03/13/2021   BILITOT 0.8 03/13/2021   3. Hypertension associated with type 2 diabetes mellitus ([place]) At goal. Medications: lisinopril 20 mg daily, indapamide 1.25 mg daily.   Plan: Avoid buying foods that are: processed, frozen, or prepackaged to avoid excess salt. We will watch for signs of hypotension as he continues lifestyle modifications.  BP Readings from Last 3 Encounters:  06/15/21 120/73  05/11/21 132/83  05/03/21 (!) 150/99   Lab Results  Component Value Date   CREATININE 0.96 03/13/2021   4. Other depression, with emotional eating At goal. Medication: Wellbutrin XL 300 mg daily.   Plan:  Decrease Wellbutrin to 150 mg daily.  Discussed cues and consequences, how thoughts affect eating, model of thoughts, feelings, and behaviors, and strategies for change by focusing on the cue. Discussed cognitive distortions, coping thoughts, and how to change your thoughts.  - Decrease buPROPion (WELLBUTRIN XL) 150 MG 24 hr tablet; Take 1 tablet (150 mg total) by mouth daily.  Dispense: 30 tablet; Refill: 1  5. Obesity BMI today 38.6  Course: [name] is currently in the action stage of change. As such, his goal is  to continue with weight loss efforts.   Nutrition goals: He has agreed to practicing portion control and making smarter food choices, such as increasing vegetables and decreasing simple carbohydrates.   Exercise goals: For substantial health benefits, adults should do at least 150 minutes (2 hours and 30 minutes) a week of  moderate-intensity, or 75 minutes (1 hour and 15 minutes) a week of vigorous-intensity aerobic physical activity, or an equivalent combination of moderate- and vigorous-intensity aerobic activity. Aerobic activity should be performed in episodes of at least 10 minutes, and preferably, it should be spread throughout the week.  Behavioral modification strategies: increasing lean protein intake, decreasing simple carbohydrates, increasing vegetables, and increasing water intake.  [name] has agreed to follow-up with our clinic in 4 weeks. He was informed of the importance of frequent follow-up visits to maximize his success with intensive lifestyle modifications for his multiple health conditions.   Objective:   Blood pressure 120/73, pulse 67, temperature 97.9 F (36.6 C), temperature source Oral, height 5\' 11"  (1.803 m), weight 276 lb (125.2 kg), SpO2 98 %. Body mass index is 38.49 kg/m.  General: Cooperative, alert, well developed, in no acute distress. HEENT: Conjunctivae and lids unremarkable. Cardiovascular: Regular rhythm.  Lungs: Normal work of breathing. Neurologic: No focal deficits.   Lab Results  Component Value Date   CREATININE 0.96 03/13/2021   BUN 21 03/13/2021   NA 141 03/13/2021   K 4.7 03/13/2021   CL 102 03/13/2021   CO2 24 03/13/2021   Lab Results  Component Value Date   ALT 37 03/13/2021   AST 22 03/13/2021   ALKPHOS 55 03/13/2021   BILITOT 0.8 03/13/2021   Lab Results  Component Value Date   HGBA1C 5.4 03/13/2021   HGBA1C 5.3 05/30/2020   HGBA1C 6.6 (H) 11/03/2019   Lab Results  Component Value Date   INSULIN 40.9 (H) 03/13/2021   INSULIN 7.5 01/04/2020   INSULIN 125.0 (H) 11/03/2019   Lab Results  Component Value Date   TSH 1.910 11/03/2019   Lab Results  Component Value Date   CHOL 112 03/13/2021   HDL 55 03/13/2021   LDLCALC 32 03/13/2021   TRIG 153 (H) 03/13/2021   CHOLHDL 2.0 03/13/2021   Lab Results  Component Value Date   VD25OH  45.6 03/13/2021   VD25OH 51.3 05/30/2020   VD25OH 58.1 01/04/2020   Lab Results  Component Value Date   WBC 6.5 05/01/2021   HGB 16.6 05/01/2021   HCT 46.8 05/01/2021   MCV 94.9 05/01/2021   PLT 131 (L) 05/01/2021   Lab Results  Component Value Date   IRON 124 11/03/2019   TIBC 419 11/03/2019   FERRITIN 269 11/03/2019   Attestation Statements:   Reviewed by clinician on day of visit: allergies, medications, problem list, medical history, surgical history, family history, social history, and previous encounter notes.  I, Water quality scientist, CMA, am acting as transcriptionist for Briscoe Deutscher, DO  I have reviewed the above documentation for accuracy and completeness, and I agree with the above. -  Briscoe Deutscher, DO, MS, FAAFP, DABOM - Family and Bariatric Medicine.

## 2021-07-13 ENCOUNTER — Other Ambulatory Visit: Payer: Self-pay

## 2021-07-13 ENCOUNTER — Encounter (INDEPENDENT_AMBULATORY_CARE_PROVIDER_SITE_OTHER): Payer: Self-pay | Admitting: Family Medicine

## 2021-07-13 ENCOUNTER — Ambulatory Visit (INDEPENDENT_AMBULATORY_CARE_PROVIDER_SITE_OTHER): Payer: 59 | Admitting: Family Medicine

## 2021-07-13 VITALS — BP 145/87 | HR 70 | Temp 97.7°F | Ht 71.0 in | Wt 270.0 lb

## 2021-07-13 DIAGNOSIS — E785 Hyperlipidemia, unspecified: Secondary | ICD-10-CM | POA: Diagnosis not present

## 2021-07-13 DIAGNOSIS — Z6841 Body Mass Index (BMI) 40.0 and over, adult: Secondary | ICD-10-CM

## 2021-07-13 DIAGNOSIS — E1169 Type 2 diabetes mellitus with other specified complication: Secondary | ICD-10-CM | POA: Diagnosis not present

## 2021-07-13 DIAGNOSIS — K760 Fatty (change of) liver, not elsewhere classified: Secondary | ICD-10-CM | POA: Diagnosis not present

## 2021-07-13 MED ORDER — TIRZEPATIDE 7.5 MG/0.5ML ~~LOC~~ SOAJ: 7.5000 mg | SUBCUTANEOUS | 2 refills | Status: DC

## 2021-07-13 NOTE — Progress Notes (Signed)
Chief Complaint:   OBESITY Scott Wolfe is here to discuss his progress with his obesity treatment plan along with follow-up of his obesity related diagnoses. See Medical Weight Management Flowsheet for complete bioelectrical impedance results.  Today's visit was #: 22 Starting weight: 326 lbs Starting date: 11/03/2019 Weight change since last visit: 6 lbs Total lbs lost to date: 56 lbs Total weight loss percentage to date: -17.18%  Nutrition Plan: Category 4 Meal Plan for 90% of the time.  Activity: Walking/cardio for 200 minutes.  Anti-obesity medications: Mounjaro 7.5 mg subcutaneously weekly. Reported side effects: None.  Interim History: Scott Wolfe says he is buying an RV.  He will decrease his Wellbutrin to 150 mg this week.  Assessment/Plan:   1. Type 2 diabetes mellitus with other specified complication, without long-term current use of insulin (HCC) Diabetes Mellitus: At goal. Medication: Mounjaro 7.5 mg subcutaneously weekly. Issues reviewed: blood sugar goals, complications of diabetes mellitus, hypoglycemia prevention and treatment, exercise, and nutrition.  Plan: Continue Mounjaro 7.5 mg subcutaneously weekly.  Will refill today, as per below.  The patient will continue to focus on protein-rich, low simple carbohydrate foods. We reviewed the importance of hydration, regular exercise for stress reduction, and restorative sleep.   Lab Results  Component Value Date   HGBA1C 5.4 03/13/2021   HGBA1C 5.3 05/30/2020   HGBA1C 6.6 (H) 11/03/2019   Lab Results  Component Value Date   LDLCALC 32 03/13/2021   CREATININE 0.96 03/13/2021   - Refill tirzepatide (MOUNJARO) 7.5 MG/0.5ML Pen; Inject 7.5 mg into the skin once a week.  Dispense: 2 mL; Refill: 2  2. NAFLD (nonalcoholic fatty liver disease) Intensive lifestyle modifications are the first line treatment for this issue. We discussed several lifestyle modifications today and he will continue to work on diet, exercise and weight  loss efforts.   3. Hyperlipidemia associated with type 2 diabetes mellitus ([place]) Course: Not optimized. Lipid-lowering medications: Crestor 10 mg daily, Repatha 140 mg every 14 days.   Plan: Dietary changes: Increase soluble fiber, decrease simple carbohydrates, decrease saturated fat. Exercise changes: Moderate to vigorous-intensity aerobic activity 150 minutes per week or as tolerated. We will continue to monitor along with PCP/specialists as it pertains to his weight loss journey.  Lab Results  Component Value Date   CHOL 112 03/13/2021   HDL 55 03/13/2021   LDLCALC 32 03/13/2021   TRIG 153 (H) 03/13/2021   CHOLHDL 2.0 03/13/2021   Lab Results  Component Value Date   ALT 37 03/13/2021   AST 22 03/13/2021   ALKPHOS 55 03/13/2021   BILITOT 0.8 03/13/2021   4. Obesity BMI today 37.7  Course: [name] is currently in the action stage of change. As such, his goal is to continue with weight loss efforts.   Nutrition goals: He has agreed to the Category 4 Plan.   Exercise goals:  As is.  Behavioral modification strategies: increasing lean protein intake, decreasing simple carbohydrates, increasing vegetables, and increasing water intake.  [name] has agreed to follow-up with our clinic in 4 weeks. He was informed of the importance of frequent follow-up visits to maximize his success with intensive lifestyle modifications for his multiple health conditions.   Objective:   Blood pressure (!) 145/87, pulse 70, temperature 97.7 F (36.5 C), temperature source Oral, height 5\' 11"  (1.803 m), weight 270 lb (122.5 kg), SpO2 99 %. Body mass index is 37.66 kg/m.  General: Cooperative, alert, well developed, in no acute distress. HEENT: Conjunctivae and lids unremarkable. Cardiovascular:  Regular rhythm.  Lungs: Normal work of breathing. Neurologic: No focal deficits.   Lab Results  Component Value Date   CREATININE 0.96 03/13/2021   BUN 21 03/13/2021   NA 141 03/13/2021   K 4.7  03/13/2021   CL 102 03/13/2021   CO2 24 03/13/2021   Lab Results  Component Value Date   ALT 37 03/13/2021   AST 22 03/13/2021   ALKPHOS 55 03/13/2021   BILITOT 0.8 03/13/2021   Lab Results  Component Value Date   HGBA1C 5.4 03/13/2021   HGBA1C 5.3 05/30/2020   HGBA1C 6.6 (H) 11/03/2019   Lab Results  Component Value Date   INSULIN 40.9 (H) 03/13/2021   INSULIN 7.5 01/04/2020   INSULIN 125.0 (H) 11/03/2019   Lab Results  Component Value Date   TSH 1.910 11/03/2019   Lab Results  Component Value Date   CHOL 112 03/13/2021   HDL 55 03/13/2021   LDLCALC 32 03/13/2021   TRIG 153 (H) 03/13/2021   CHOLHDL 2.0 03/13/2021   Lab Results  Component Value Date   VD25OH 45.6 03/13/2021   VD25OH 51.3 05/30/2020   VD25OH 58.1 01/04/2020   Lab Results  Component Value Date   WBC 6.5 05/01/2021   HGB 16.6 05/01/2021   HCT 46.8 05/01/2021   MCV 94.9 05/01/2021   PLT 131 (L) 05/01/2021   Lab Results  Component Value Date   IRON 124 11/03/2019   TIBC 419 11/03/2019   FERRITIN 269 11/03/2019   Attestation Statements:   Reviewed by clinician on day of visit: allergies, medications, problem list, medical history, surgical history, family history, social history, and previous encounter notes.  I, Water quality scientist, CMA, am acting as transcriptionist for Briscoe Deutscher, DO  I have reviewed the above documentation for accuracy and completeness, and I agree with the above. -  Briscoe Deutscher, DO, MS, FAAFP, DABOM - Family and Bariatric Medicine.

## 2021-07-16 NOTE — Progress Notes (Signed)
This encounter was created in error - please disregard.

## 2021-07-17 ENCOUNTER — Encounter: Payer: 59 | Admitting: Cardiovascular Disease

## 2021-08-17 ENCOUNTER — Other Ambulatory Visit: Payer: Self-pay | Admitting: Cardiovascular Disease

## 2021-08-21 ENCOUNTER — Other Ambulatory Visit: Payer: Self-pay

## 2021-08-21 ENCOUNTER — Encounter (INDEPENDENT_AMBULATORY_CARE_PROVIDER_SITE_OTHER): Payer: Self-pay | Admitting: Family Medicine

## 2021-08-21 ENCOUNTER — Ambulatory Visit (INDEPENDENT_AMBULATORY_CARE_PROVIDER_SITE_OTHER): Payer: 59 | Admitting: Family Medicine

## 2021-08-21 VITALS — BP 132/76 | HR 68 | Temp 97.6°F | Ht 71.0 in | Wt 259.0 lb

## 2021-08-21 DIAGNOSIS — E7849 Other hyperlipidemia: Secondary | ICD-10-CM

## 2021-08-21 DIAGNOSIS — E1169 Type 2 diabetes mellitus with other specified complication: Secondary | ICD-10-CM

## 2021-08-21 DIAGNOSIS — M792 Neuralgia and neuritis, unspecified: Secondary | ICD-10-CM | POA: Diagnosis not present

## 2021-08-21 DIAGNOSIS — Z6841 Body Mass Index (BMI) 40.0 and over, adult: Secondary | ICD-10-CM

## 2021-08-21 MED ORDER — GABAPENTIN 100 MG PO CAPS: 100.0000 mg | ORAL_CAPSULE | Freq: Three times a day (TID) | ORAL | 0 refills | Status: DC

## 2021-08-21 NOTE — Progress Notes (Signed)
Chief Complaint:   OBESITY Scott Wolfe is here to discuss his progress with his obesity treatment plan along with follow-up of his obesity related diagnoses. See Medical Weight Management Flowsheet for complete bioelectrical impedance results.  Today's visit was #: 23 Starting weight: 326 lbs Starting date: 11/03/2019 Weight change since last visit: 11 lbs Total lbs lost to date: 65 lbs Total weight loss percentage to date: -20.55%  Nutrition Plan: Category 4 Plan for 70% of the time. Activity: Walking for 30 minutes 5 times per week.  Anti-obesity medications: Mounjaro 7.5 mg subcutaneously weekly. Reported side effects: None.  Interim History: Scott Wolfe says he is weaning Wellbutrin and it is going well.  He would like to trial stopping Repatha.  He says he plans to travel in his camper for about a year.  Assessment/Plan:   1. Other hyperlipidemia Course: Controlled. Lipid-lowering medications: Repatha 140 mg every 14 days and Crestor 10 mg daily.   Plan: Stop Repatha.  Recheck labs in 1-2 months.  Continue Crestor.  Dietary changes: Increase soluble fiber, decrease simple carbohydrates, decrease saturated fat. Exercise changes: Moderate to vigorous-intensity aerobic activity 150 minutes per week or as tolerated. We will continue to monitor along with PCP/specialists as it pertains to his weight loss journey.  Lab Results  Component Value Date   CHOL 112 03/13/2021   HDL 55 03/13/2021   LDLCALC 32 03/13/2021   TRIG 153 (H) 03/13/2021   CHOLHDL 2.0 03/13/2021   Lab Results  Component Value Date   ALT 37 03/13/2021   AST 22 03/13/2021   ALKPHOS 55 03/13/2021   BILITOT 0.8 03/13/2021   2. Neuropathic pain Benecio endorses neuropathic pain in his legs.  Plan:  Increase gabapentin to 100-300 mg three times daily.  - Increase and refill gabapentin (NEURONTIN) 100 MG capsule; Take 1-3 capsules (100-300 mg total) by mouth 3 (three) times daily.  Dispense: 270 capsule; Refill: 0  3.  Type 2 diabetes mellitus with other specified complication, without long-term current use of insulin (HCC) Diabetes Mellitus: At goal. Medication: Mounjaro 7.5 mg subcutaneously weekly. Issues reviewed: blood sugar goals, complications of diabetes mellitus, hypoglycemia prevention and treatment, exercise, and nutrition.  Plan:  Refill Mounjaro 7.5 mg subcutaneously weekly. The patient will continue to focus on protein-rich, low simple carbohydrate foods. We reviewed the importance of hydration, regular exercise for stress reduction, and restorative sleep.   Lab Results  Component Value Date   HGBA1C 5.4 03/13/2021   HGBA1C 5.3 05/30/2020   HGBA1C 6.6 (H) 11/03/2019   Lab Results  Component Value Date   LDLCALC 32 03/13/2021   CREATININE 0.96 03/13/2021   4. Obesity BMI today 35.6  Course: Scott Wolfe is currently in the action stage of change. As such, his goal is to continue with weight loss efforts.   Nutrition goals: He has agreed to the Category 4 Plan.   Exercise goals:  As is.  Behavioral modification strategies: increasing lean protein intake, decreasing simple carbohydrates, increasing vegetables, and increasing water intake.  Scott Wolfe has agreed to follow-up with our clinic in 4 weeks. He was informed of the importance of frequent follow-up visits to maximize his success with intensive lifestyle modifications for his multiple health conditions.   Objective:   Blood pressure 132/76, pulse 68, temperature 97.6 F (36.4 C), temperature source Oral, height 5\' 11"  (1.803 m), weight 259 lb (117.5 kg), SpO2 99 %. Body mass index is 36.12 kg/m.  General: Cooperative, alert, well developed, in no acute distress. HEENT: Conjunctivae  and lids unremarkable. Cardiovascular: Regular rhythm.  Lungs: Normal work of breathing. Neurologic: No focal deficits.   Lab Results  Component Value Date   CREATININE 0.96 03/13/2021   BUN 21 03/13/2021   NA 141 03/13/2021   K 4.7 03/13/2021   CL 102  03/13/2021   CO2 24 03/13/2021   Lab Results  Component Value Date   ALT 37 03/13/2021   AST 22 03/13/2021   ALKPHOS 55 03/13/2021   BILITOT 0.8 03/13/2021   Lab Results  Component Value Date   HGBA1C 5.4 03/13/2021   HGBA1C 5.3 05/30/2020   HGBA1C 6.6 (H) 11/03/2019   Lab Results  Component Value Date   INSULIN 40.9 (H) 03/13/2021   INSULIN 7.5 01/04/2020   INSULIN 125.0 (H) 11/03/2019   Lab Results  Component Value Date   TSH 1.910 11/03/2019   Lab Results  Component Value Date   CHOL 112 03/13/2021   HDL 55 03/13/2021   LDLCALC 32 03/13/2021   TRIG 153 (H) 03/13/2021   CHOLHDL 2.0 03/13/2021   Lab Results  Component Value Date   VD25OH 45.6 03/13/2021   VD25OH 51.3 05/30/2020   VD25OH 58.1 01/04/2020   Lab Results  Component Value Date   WBC 6.5 05/01/2021   HGB 16.6 05/01/2021   HCT 46.8 05/01/2021   MCV 94.9 05/01/2021   PLT 131 (L) 05/01/2021   Lab Results  Component Value Date   IRON 124 11/03/2019   TIBC 419 11/03/2019   FERRITIN 269 11/03/2019   Attestation Statements:   Reviewed by clinician on day of visit: allergies, medications, problem list, medical history, surgical history, family history, social history, and previous encounter notes.  I, Water quality scientist, CMA, am acting as transcriptionist for Briscoe Deutscher, DO  I have reviewed the above documentation for accuracy and completeness, and I agree with the above. -  Briscoe Deutscher, DO, MS, FAAFP, DABOM - Family and Bariatric Medicine.

## 2021-09-14 ENCOUNTER — Other Ambulatory Visit: Payer: Self-pay | Admitting: Cardiovascular Disease

## 2021-09-30 ENCOUNTER — Other Ambulatory Visit: Payer: Self-pay | Admitting: Cardiovascular Disease

## 2021-10-02 ENCOUNTER — Other Ambulatory Visit: Payer: Self-pay

## 2021-10-02 ENCOUNTER — Ambulatory Visit (INDEPENDENT_AMBULATORY_CARE_PROVIDER_SITE_OTHER): Payer: 59 | Admitting: Family Medicine

## 2021-10-02 ENCOUNTER — Encounter (INDEPENDENT_AMBULATORY_CARE_PROVIDER_SITE_OTHER): Payer: Self-pay | Admitting: Family Medicine

## 2021-10-02 VITALS — BP 138/78 | HR 69 | Temp 97.8°F | Ht 71.0 in | Wt 252.0 lb

## 2021-10-02 DIAGNOSIS — Z6835 Body mass index (BMI) 35.0-35.9, adult: Secondary | ICD-10-CM

## 2021-10-02 DIAGNOSIS — E669 Obesity, unspecified: Secondary | ICD-10-CM | POA: Diagnosis not present

## 2021-10-02 DIAGNOSIS — E1169 Type 2 diabetes mellitus with other specified complication: Secondary | ICD-10-CM | POA: Diagnosis not present

## 2021-10-02 DIAGNOSIS — M792 Neuralgia and neuritis, unspecified: Secondary | ICD-10-CM

## 2021-10-02 DIAGNOSIS — G4733 Obstructive sleep apnea (adult) (pediatric): Secondary | ICD-10-CM | POA: Diagnosis not present

## 2021-10-02 DIAGNOSIS — Z9989 Dependence on other enabling machines and devices: Secondary | ICD-10-CM

## 2021-10-02 MED ORDER — GABAPENTIN 100 MG PO CAPS: 100.0000 mg | ORAL_CAPSULE | Freq: Three times a day (TID) | ORAL | 0 refills | Status: AC

## 2021-10-02 MED ORDER — TIRZEPATIDE 7.5 MG/0.5ML ~~LOC~~ SOAJ: 7.5000 mg | SUBCUTANEOUS | 12 refills | Status: AC

## 2021-10-03 NOTE — Progress Notes (Signed)
Chief Complaint:   OBESITY Scott Wolfe is here to discuss his progress with his obesity treatment plan along with follow-up of his obesity related diagnoses. See Medical Weight Management Flowsheet for complete bioelectrical impedance results.  Today's visit was #: 24 Starting weight: 326 lbs Starting date: 11/03/2019 Weight change since last visit: 7 lbs Total lbs lost to date: 74 lbs Total weight loss percentage to date: -22.70%  Nutrition Plan: Category 4 Plan for 80% of the time.  Activity: Walking for 30 minutes 6 times per week.  Anti-obesity medications: Mounjaro 7.5 mg subcutaneously weekly. Reported side effects: None.  Interim History: Scott Wolfe is down 74 pounds today!  This is his last visit.  He will be retiring and traveling.  Assessment/Plan:   1. Type 2 diabetes mellitus with other specified complication, without long-term current use of insulin (HCC) Diabetes Mellitus: At goal. Medication: Mounjaro 7.5 mg subcutaneously weekly. Issues reviewed: blood sugar goals, complications of diabetes mellitus, hypoglycemia prevention and treatment, exercise, and nutrition.  Plan: Continue Mounjaro 7.5 mg subcutaneously weekly.  The patient will continue to focus on protein-rich, low simple carbohydrate foods. We reviewed the importance of hydration, regular exercise for stress reduction, and restorative sleep.   Lab Results  Component Value Date   HGBA1C 5.4 03/13/2021   HGBA1C 5.3 05/30/2020   HGBA1C 6.6 (H) 11/03/2019   Lab Results  Component Value Date   LDLCALC 32 03/13/2021   CREATININE 0.96 03/13/2021   - Refill tirzepatide (MOUNJARO) 7.5 MG/0.5ML Pen; Inject 7.5 mg into the skin once a week.  Dispense: 2 mL; Refill: 12  2. Neuropathic pain Scott Wolfe endorses neuropathic pain in his legs.   Plan:  Continue gabapentin 100-300 mg three times daily.  - Refill gabapentin (NEURONTIN) 100 MG capsule; Take 1-3 capsules (100-300 mg total) by mouth 3 (three) times daily.  Dispense:  270 capsule; Refill: 0  3. OSA on CPAP OSA is a cause of systemic hypertension and is associated with an increased incidence of stroke, heart failure, atrial fibrillation, and coronary heart disease. Severe OSA increases all-cause mortality and cardiovascular mortality.   Goal: Treatment of OSA via CPAP compliance and weight loss. Plasma ghrelin levels (appetite or "hunger hormone") are significantly higher in OSA patients than in BMI-matched controls, but decrease to levels similar to those of obese patients without OSA after CPAP treatment.  Weight loss improves OSA by several mechanisms, including reduction in fatty tissue in the throat (i.e. parapharyngeal fat) and the tongue. Loss of abdominal fat increases mediastinal traction on the upper airway making it less likely to collapse during sleep. Studies have also shown that compliance with CPAP treatment improves leptin (hunger inhibitory hormone) imbalance.  4. Obesity with current BMI of 35.2  Course: Scott Wolfe is currently in the action stage of change. As such, his goal is to continue with weight loss efforts.   Nutrition goals: He has agreed to the Category 4 Plan.   Exercise goals:  As is.  Behavioral modification strategies: increasing lean protein intake, decreasing simple carbohydrates, increasing vegetables, and increasing water intake.  Objective:   Blood pressure 138/78, pulse 69, temperature 97.8 F (36.6 C), height 5\' 11"  (1.803 m), weight 252 lb (114.3 kg), SpO2 99 %. Body mass index is 35.15 kg/m.  General: Cooperative, alert, well developed, in no acute distress. HEENT: Conjunctivae and lids unremarkable. Cardiovascular: Regular rhythm.  Lungs: Normal work of breathing. Neurologic: No focal deficits.   Lab Results  Component Value Date   CREATININE 0.96 03/13/2021  BUN 21 03/13/2021   NA 141 03/13/2021   K 4.7 03/13/2021   CL 102 03/13/2021   CO2 24 03/13/2021   Lab Results  Component Value Date   ALT 37  03/13/2021   AST 22 03/13/2021   ALKPHOS 55 03/13/2021   BILITOT 0.8 03/13/2021   Lab Results  Component Value Date   HGBA1C 5.4 03/13/2021   HGBA1C 5.3 05/30/2020   HGBA1C 6.6 (H) 11/03/2019   Lab Results  Component Value Date   INSULIN 40.9 (H) 03/13/2021   INSULIN 7.5 01/04/2020   INSULIN 125.0 (H) 11/03/2019   Lab Results  Component Value Date   TSH 1.910 11/03/2019   Lab Results  Component Value Date   CHOL 112 03/13/2021   HDL 55 03/13/2021   LDLCALC 32 03/13/2021   TRIG 153 (H) 03/13/2021   CHOLHDL 2.0 03/13/2021   Lab Results  Component Value Date   VD25OH 45.6 03/13/2021   VD25OH 51.3 05/30/2020   VD25OH 58.1 01/04/2020   Lab Results  Component Value Date   WBC 6.5 05/01/2021   HGB 16.6 05/01/2021   HCT 46.8 05/01/2021   MCV 94.9 05/01/2021   PLT 131 (L) 05/01/2021   Lab Results  Component Value Date   IRON 124 11/03/2019   TIBC 419 11/03/2019   FERRITIN 269 11/03/2019   Attestation Statements:   Reviewed by clinician on day of visit: allergies, medications, problem list, medical history, surgical history, family history, social history, and previous encounter notes.  I, Water quality scientist, CMA, am acting as transcriptionist for Briscoe Deutscher, DO  I have reviewed the above documentation for accuracy and completeness, and I agree with the above. -  Briscoe Deutscher, DO, MS, FAAFP, DABOM - Family and Bariatric Medicine.

## 2021-10-18 ENCOUNTER — Other Ambulatory Visit: Payer: Self-pay | Admitting: Cardiovascular Disease

## 2021-10-23 ENCOUNTER — Encounter (INDEPENDENT_AMBULATORY_CARE_PROVIDER_SITE_OTHER): Payer: Self-pay | Admitting: Family Medicine

## 2021-10-23 DIAGNOSIS — E1169 Type 2 diabetes mellitus with other specified complication: Secondary | ICD-10-CM

## 2021-10-24 MED ORDER — TIRZEPATIDE 5 MG/0.5ML ~~LOC~~ SOAJ
5.0000 mg | SUBCUTANEOUS | 0 refills | Status: DC
Start: 2021-10-24 — End: 2021-10-31

## 2021-10-24 MED ORDER — TIRZEPATIDE 10 MG/0.5ML ~~LOC~~ SOAJ: 10.0000 mg | SUBCUTANEOUS | 0 refills | Status: DC

## 2021-10-24 NOTE — Telephone Encounter (Signed)
Dr.Wallace °

## 2021-10-31 ENCOUNTER — Inpatient Hospital Stay: Payer: 59

## 2021-10-31 ENCOUNTER — Inpatient Hospital Stay: Payer: 59 | Attending: Oncology | Admitting: Oncology

## 2021-10-31 ENCOUNTER — Other Ambulatory Visit: Payer: Self-pay

## 2021-10-31 VITALS — BP 150/95 | HR 70 | Temp 97.8°F | Resp 18 | Ht 71.0 in | Wt 256.4 lb

## 2021-10-31 DIAGNOSIS — E669 Obesity, unspecified: Secondary | ICD-10-CM | POA: Insufficient documentation

## 2021-10-31 DIAGNOSIS — G4730 Sleep apnea, unspecified: Secondary | ICD-10-CM | POA: Insufficient documentation

## 2021-10-31 DIAGNOSIS — I10 Essential (primary) hypertension: Secondary | ICD-10-CM | POA: Diagnosis not present

## 2021-10-31 DIAGNOSIS — D696 Thrombocytopenia, unspecified: Secondary | ICD-10-CM | POA: Diagnosis present

## 2021-10-31 LAB — CBC WITH DIFFERENTIAL (CANCER CENTER ONLY)
Abs Immature Granulocytes: 0.01 10*3/uL (ref 0.00–0.07)
Basophils Absolute: 0.1 10*3/uL (ref 0.0–0.1)
Basophils Relative: 1 %
Eosinophils Absolute: 0.1 10*3/uL (ref 0.0–0.5)
Eosinophils Relative: 3 %
HCT: 47 % (ref 39.0–52.0)
Hemoglobin: 16.3 g/dL (ref 13.0–17.0)
Immature Granulocytes: 0 %
Lymphocytes Relative: 25 %
Lymphs Abs: 1.4 10*3/uL (ref 0.7–4.0)
MCH: 32.7 pg (ref 26.0–34.0)
MCHC: 34.7 g/dL (ref 30.0–36.0)
MCV: 94.4 fL (ref 80.0–100.0)
Monocytes Absolute: 0.6 10*3/uL (ref 0.1–1.0)
Monocytes Relative: 10 %
Neutro Abs: 3.5 10*3/uL (ref 1.7–7.7)
Neutrophils Relative %: 61 %
Platelet Count: 110 10*3/uL — ABNORMAL LOW (ref 150–400)
RBC: 4.98 MIL/uL (ref 4.22–5.81)
RDW: 12.9 % (ref 11.5–15.5)
WBC Count: 5.6 10*3/uL (ref 4.0–10.5)
nRBC: 0 % (ref 0.0–0.2)

## 2021-10-31 NOTE — Progress Notes (Signed)
°  [facility] OFFICE PROGRESS NOTE   Diagnosis: Thrombocytopenia  INTERVAL HISTORY:   Scott Wolfe returns as scheduled.  He feels well.  No bleeding.  He continues to follow a nutrition program for weight loss.  He plans to relocate to [place] in April.  Objective:  Vital signs in last 24 hours:  Blood pressure (!) 150/95, pulse 70, temperature 97.8 F (36.6 C), temperature source Oral, resp. rate 18, height 5\' 11"  (1.803 m), weight 256 lb 6.4 oz (116.3 kg), SpO2 100 %.    Lymphatics: No cervical, supraclavicular, or inguinal nodes.  Right greater than left prominent axillary fat pad Resp: Lungs clear bilaterally Cardio: Regular rate and rhythm GI: No hepatosplenomegaly Vascular: No leg edema Breast: Soft mobile fullness in the upper right breast   Lab Results:  Lab Results  Component Value Date   WBC 5.6 10/31/2021   HGB 16.3 10/31/2021   HCT 47.0 10/31/2021   MCV 94.4 10/31/2021   PLT 110 (L) 10/31/2021   NEUTROABS 3.5 10/31/2021    CMP  Lab Results  Component Value Date   NA 141 03/13/2021   K 4.7 03/13/2021   CL 102 03/13/2021   CO2 24 03/13/2021   GLUCOSE 93 03/13/2021   BUN 21 03/13/2021   CREATININE 0.96 03/13/2021   CALCIUM 9.4 03/13/2021   PROT 6.7 03/13/2021   ALBUMIN 4.6 03/13/2021   AST 22 03/13/2021   ALT 37 03/13/2021   ALKPHOS 55 03/13/2021   BILITOT 0.8 03/13/2021   GFRNONAA 67 05/30/2020   GFRAA 78 05/30/2020    No results found for: CEA1, CEA, CAN199, CA125  No results found for: INR, LABPROT  Imaging:  No results found.  Medications: I have reviewed the patient's current medications.   Assessment/Plan: Thrombocytopenia, chronic mild Area of firm fullness right breast upper inner quadrant Obesity Hypertension Sleep apnea   Disposition: Mr. Mazzie has chronic mild thrombocytopenia.  This is likely a normal variant versus chronic ITP.  He will seek medical attention for bleeding or bruising.  He plans to  establish with a primary provider when he relocates to [place].  He will be sure the platelet count is checked.  The soft mobile fullness in the axillae is likely related to prominent fat pads.  He will seek medical attention for enlargement of these areas or the chronic right breast fullness.  He reports the right breast lesion has been present for many years.  He is not scheduled for follow-up in the hematology clinic.  I am available to see him in the future as needed.    Betsy Coder, MD  10/31/2021  9:36 AM

## 2021-11-04 ENCOUNTER — Emergency Department (HOSPITAL_COMMUNITY)
Admission: EM | Admit: 2021-11-04 | Discharge: 2021-11-04 | Disposition: A | Discharge status: Home / Self Care | Payer: 59 | Attending: Emergency Medicine | Admitting: Emergency Medicine

## 2021-11-04 ENCOUNTER — Emergency Department (HOSPITAL_COMMUNITY): Payer: 59

## 2021-11-04 ENCOUNTER — Other Ambulatory Visit: Payer: Self-pay

## 2021-11-04 ENCOUNTER — Encounter (HOSPITAL_COMMUNITY): Payer: Self-pay

## 2021-11-04 DIAGNOSIS — Z79899 Other long term (current) drug therapy: Secondary | ICD-10-CM | POA: Insufficient documentation

## 2021-11-04 DIAGNOSIS — Z7982 Long term (current) use of aspirin: Secondary | ICD-10-CM | POA: Insufficient documentation

## 2021-11-04 DIAGNOSIS — I10 Essential (primary) hypertension: Secondary | ICD-10-CM | POA: Diagnosis not present

## 2021-11-04 DIAGNOSIS — N401 Enlarged prostate with lower urinary tract symptoms: Secondary | ICD-10-CM | POA: Diagnosis not present

## 2021-11-04 DIAGNOSIS — K5900 Constipation, unspecified: Secondary | ICD-10-CM | POA: Insufficient documentation

## 2021-11-04 DIAGNOSIS — E119 Type 2 diabetes mellitus without complications: Secondary | ICD-10-CM | POA: Diagnosis not present

## 2021-11-04 DIAGNOSIS — R109 Unspecified abdominal pain: Secondary | ICD-10-CM | POA: Diagnosis present

## 2021-11-04 DIAGNOSIS — N200 Calculus of kidney: Secondary | ICD-10-CM

## 2021-11-04 DIAGNOSIS — N400 Enlarged prostate without lower urinary tract symptoms: Secondary | ICD-10-CM

## 2021-11-04 LAB — CBC
HCT: 48.5 % (ref 39.0–52.0)
Hemoglobin: 16.9 g/dL (ref 13.0–17.0)
MCH: 33.3 pg (ref 26.0–34.0)
MCHC: 34.8 g/dL (ref 30.0–36.0)
MCV: 95.7 fL (ref 80.0–100.0)
Platelets: 104 10*3/uL — ABNORMAL LOW (ref 150–400)
RBC: 5.07 MIL/uL (ref 4.22–5.81)
RDW: 12.7 % (ref 11.5–15.5)
WBC: 6.4 10*3/uL (ref 4.0–10.5)
nRBC: 0 % (ref 0.0–0.2)

## 2021-11-04 LAB — COMPREHENSIVE METABOLIC PANEL
ALT: 38 U/L (ref 0–44)
AST: 28 U/L (ref 15–41)
Albumin: 4.5 g/dL (ref 3.5–5.0)
Alkaline Phosphatase: 58 U/L (ref 38–126)
Anion gap: 7 (ref 5–15)
BUN: 15 mg/dL (ref 8–23)
CO2: 26 mmol/L (ref 22–32)
Calcium: 9.5 mg/dL (ref 8.9–10.3)
Chloride: 107 mmol/L (ref 98–111)
Creatinine, Ser: 0.85 mg/dL (ref 0.61–1.24)
GFR, Estimated: >60 mL/min (ref >60)
Glucose, Bld: 97 mg/dL (ref 70–99)
Potassium: 4.3 mmol/L (ref 3.5–5.1)
Sodium: 140 mmol/L (ref 135–145)
Total Bilirubin: 1.5 mg/dL — ABNORMAL HIGH (ref 0.3–1.2)
Total Protein: 7.4 g/dL (ref 6.5–8.1)

## 2021-11-04 LAB — URINALYSIS, ROUTINE W REFLEX MICROSCOPIC
Bilirubin Urine: NEGATIVE
Glucose, UA: NEGATIVE mg/dL
Hgb urine dipstick: NEGATIVE
Ketones, ur: NEGATIVE mg/dL
Leukocytes,Ua: NEGATIVE
Nitrite: NEGATIVE
Protein, ur: NEGATIVE mg/dL
Specific Gravity, Urine: 1.02 (ref 1.005–1.030)
pH: 5 (ref 5.0–8.0)

## 2021-11-04 LAB — LIPASE, BLOOD: Lipase: 37 U/L (ref 11–51)

## 2021-11-04 MED ORDER — KETOROLAC TROMETHAMINE 30 MG/ML IJ SOLN
30.0000 mg | Freq: Once | INTRAMUSCULAR | Status: AC
  Administered 2021-11-04: 30 mg via INTRAVENOUS
  Filled 2021-11-04: qty 1

## 2021-11-04 MED ORDER — OXYCODONE-ACETAMINOPHEN 5-325 MG PO TABS: 1.0000 | ORAL_TABLET | ORAL | 0 refills | Status: AC | PRN

## 2021-11-04 MED ORDER — ONDANSETRON HCL 4 MG/2ML IJ SOLN
4.0000 mg | Freq: Once | INTRAMUSCULAR | Status: AC
  Administered 2021-11-04: 4 mg via INTRAVENOUS
  Filled 2021-11-04: qty 2

## 2021-11-04 MED ORDER — HYDROMORPHONE HCL 1 MG/ML IJ SOLN
1.0000 mg | Freq: Once | INTRAMUSCULAR | Status: AC
  Administered 2021-11-04: 1 mg via INTRAVENOUS
  Filled 2021-11-04: qty 1

## 2021-11-04 MED ORDER — TAMSULOSIN HCL 0.4 MG PO CAPS: 0.4000 mg | ORAL_CAPSULE | Freq: Every day | ORAL | 0 refills | Status: AC

## 2021-11-04 NOTE — Discharge Instructions (Signed)
As discussed, I recommend that you follow-up with urology.  I have listed a local urologist for you or you may follow-up with the urologist of your preference.  Your CT also shows that you have some constipation.  I recommend that you take over-the-counter MiraLAX, 1 heaping capful mixed in 8 to 10 ounces of water or juice 1-2 times daily until relief of your constipation.  Take the prescribed medication as directed.  Please return to the emergency department for any new or worsening symptoms. ?

## 2021-11-04 NOTE — ED Provider Notes (Signed)
[facility] EMERGENCY DEPARTMENT Provider Note   CSN: [id number] Arrival date & time: 11/04/21  1305     History  Chief Complaint  Patient presents with   Flank Pain    Scott Wolfe is a 61 y.o. male.   Flank Pain Pertinent negatives include no chest pain, no abdominal pain and no shortness of breath.      Scott Wolfe is a 61 y.o. male with past medical history of type 2 diabetes, NAFLD, recurrent kidney stones and hypertension who presents to the Emergency Department complaining of gradually worsening right flank pain x4 days.  Reports history of multiple prior kidney stones and current pain feels similar to previous.  He describes pain of the flank that radiates laterally towards the abdomen, but has not radiated to his groin or testicles.  He denies any urinary hesitation, burning or occultly voiding.  No nausea or vomiting.  He states the pain is waxing and waning in severity.  Initially, he had some leftover pain medicine which he has been taking, but has ran out and pain has become worse since that time.  Denies fever, chills, abdominal pain, chest pain or shortness of breath.  Home Medications Prior to Admission medications   Medication Sig Start Date End Date Taking? Authorizing Provider  aspirin 81 MG tablet Take 1 tablet (81 mg total) by mouth daily. 09/10/14   Renette Butters, MD  cetirizine (ZYRTEC) 10 MG tablet Take 10 mg by mouth daily as needed for allergies.    [provider]  gabapentin (NEURONTIN) 100 MG capsule Take 1-3 capsules (100-300 mg total) by mouth 3 (three) times daily. 10/02/21   Briscoe Deutscher, DO  Glucosamine-Chondroitin (OSTEO BI-FLEX REGULAR STRENGTH PO) Take 1 tablet by mouth daily.    [provider]  lisinopril (ZESTRIL) 20 MG tablet Take 20 mg by mouth daily.     [provider]  Multiple Vitamin (MULTIVITAMIN WITH MINERALS) TABS tablet Take 1 tablet by mouth daily.    [provider]  rosuvastatin (CRESTOR) 10  MG tablet Take 1 tablet (10 mg total) by mouth daily. Pt. Needs to make overdue appt. With Cardiologist in order to receive further refills. Thank You 2nd Attempt. 10/02/21   Nahser, Wonda Cheng, MD  tirzepatide [facility] 2) 7.5 MG/0.5ML Pen Inject 7.5 mg into the skin once a week. 10/02/21   Briscoe Deutscher, DO      Allergies    Patient has no known allergies.    Review of Systems   Review of Systems  Constitutional:  Negative for chills and fever.  Respiratory:  Negative for cough, shortness of breath and wheezing.   Cardiovascular:  Negative for chest pain.  Gastrointestinal:  Negative for abdominal pain, diarrhea, nausea and vomiting.  Genitourinary:  Positive for flank pain. Negative for decreased urine volume, difficulty urinating, dysuria, hematuria, penile pain, penile swelling, scrotal swelling and testicular pain.  Skin:  Negative for color change and rash.  Neurological:  Negative for dizziness and weakness.  All other systems reviewed and are negative.  Physical Exam Updated Vital Signs BP (!) 168/106    Pulse 72    Temp 97.9 F (36.6 C) (Oral)    Resp 18    Ht '5\' 11"'$  (1.803 m)    Wt 117 kg    SpO2 100%    BMI 35.98 kg/m  Physical Exam Vitals and nursing note reviewed.  Constitutional:      General: He is not in acute distress.    Appearance: Normal  appearance.     Comments: Patient is uncomfortable appearing, mildly diaphoretic  Eyes:     Pupils: Pupils are equal, round, and reactive to light.  Cardiovascular:     Rate and Rhythm: Normal rate and regular rhythm.     Pulses: Normal pulses.  Pulmonary:     Effort: Pulmonary effort is normal.     Breath sounds: Normal breath sounds.  Abdominal:     General: There is no distension.     Palpations: Abdomen is soft.     Tenderness: There is no abdominal tenderness. There is no right CVA tenderness or left CVA tenderness.  Musculoskeletal:        General: Normal range of motion.     Cervical back: Normal range of motion.   Skin:    General: Skin is warm.     Capillary Refill: Capillary refill takes less than 2 seconds.  Neurological:     General: No focal deficit present.     Mental Status: He is alert.     Sensory: No sensory deficit.     Motor: No weakness.    ED Results / Procedures / Treatments   Labs (all labs ordered are listed, but only abnormal results are displayed) Labs Reviewed  COMPREHENSIVE METABOLIC PANEL - Abnormal; Notable for the following components:      Result Value   Total Bilirubin 1.5 (*)    All other components within normal limits  CBC - Abnormal; Notable for the following components:   Platelets 104 (*)    All other components within normal limits  URINALYSIS, ROUTINE W REFLEX MICROSCOPIC  LIPASE, BLOOD    EKG None  Radiology CT Renal Stone Study  Result Date: 11/04/2021 CLINICAL DATA:  Right-sided flank pain.  History of nephrolithiasis. EXAM: CT ABDOMEN AND PELVIS WITHOUT CONTRAST TECHNIQUE: Multidetector CT imaging of the abdomen and pelvis was performed following the standard protocol without IV contrast. RADIATION DOSE REDUCTION: This exam was performed according to the departmental dose-optimization program which includes automated exposure control, adjustment of the mA and/or kV according to patient size and/or use of iterative reconstruction technique. COMPARISON:  Chest CT-05/29/2021 FINDINGS: The lack of intravenous contrast limits the ability to evaluate solid abdominal organs. Lower chest: Limited visualization of the lower thorax demonstrates minimal dependent subpleural ground-glass atelectasis. Minimal subsegmental atelectasis involving the left costophrenic angle. No discrete focal airspace opacities. No pleural effusion. Normal heart size. Coronary artery calcifications. No pericardial effusion. Hepatobiliary: Normal hepatic contour. Normal noncontrast appearance of the gallbladder given degree distention. No radiopaque gallstones. No ascites. Pancreas: Normal  noncontrast appearance of the pancreas. Spleen: Normal noncontrast appearance of the spleen. Adrenals/Urinary Tract: Bilateral hypoattenuating renal lesions are incompletely characterized on the present examination though may represent renal cysts with dominant right-sided renal lesion measuring 2.3 cm in diameter (image 43, series 2) and dominant exophytic left-sided renal lesion measuring 1.1 cm (image 42, series 2). Suspected left-sided parapelvic cyst. There is a punctate (approximately 3 mm) nonobstructing stone within mid aspect of the right ureter (axial image 52, series 2; coronal image 75, series 5), without associated upstream ureterectasis or pelvicaliectasis. No additional renal stones are identified. No evidence of right-sided urinary obstruction. Normal noncontrast appearance the bilateral adrenal glands. Mild thickening of the urinary bladder wall, potentially accentuated due to underdistention. Stomach/Bowel: Large colonic stool burden without evidence of enteric obstruction. Normal noncontrast appearance of the terminal ileum. The appendix is not visualized, however there is no pericecal inflammatory change. Small to moderate-sized hiatal hernia. No  pneumoperitoneum, pneumatosis or portal venous gas. Vascular/Lymphatic: Minimal amount of atherosclerotic plaque within normal caliber abdominal aorta. Incidental note is made of a retroaortic left renal vein. Borderline enlarged porta hepatis lymph nodes are presumably reactive in etiology with index porta hepatis node measuring 1.2 cm in greatest short axis diameter (image 29, series 2). Additional scattered retroperitoneal and mesenteric lymph nodes are numerous though individually not enlarged by size criteria, likely reactive due to patient body habitus. No definitive bulky retroperitoneal, mesenteric, pelvic or inguinal lymphadenopathy. Reproductive: The prostate appears mildly enlarged with mass effect on the undersurface of the urinary bladder  (sagittal image 84, series 6). Other: Moderate to large sized bilateral mesenteric fat containing indirect inguinal hernias. Small periumbilical mesenteric fat containing hernia. Musculoskeletal: No acute or aggressive osseous abnormalities. Mild-to-moderate multilevel lumbar spine DDD, worse at L3-L4 with disc space height loss, endplate irregularity and sclerosis. Mild degenerative change the bilateral hips with joint space loss, subchondral sclerosis and osteophytosis, right slightly greater than left. IMPRESSION: 1. Punctate (approximately 3 mm) stone within the mid aspect of the right ureter is without associated upstream ureterectasis or pelvicaliectasis. No additional renal stones are identified. No evidence of left-sided urinary obstruction. 2. Coronary artery calcifications. Aortic Atherosclerosis (ICD10-I70.0). 3. Large colonic stool burden without evidence of enteric obstruction. 4. Small to moderate-sized hiatal hernia. 5. Mild prostatomegaly with apparent mass effect upon the undersurface of the urinary bladder diffuse thickening of the urinary bladder wall. If not recently performed, further evaluation with DRE is advised. Electronically Signed   By: Sandi Mariscal M.D.   On: 11/04/2021 14:39    Procedures Procedures    Medications Ordered in ED Medications - No data to display  ED Course/ Medical Decision Making/ A&P                           Medical Decision Making Patient here with gradually worsening right flank pain x4 days.  History of kidney stones.  States current pain feels similar to previous kidney stones.  Denies any fever chills nausea or vomiting.  No dysuria, hematuria, or difficulty voiding.   On exam, patient is uncomfortable appearing.  No nausea or vomiting.  He does not appear toxic.  Will obtain baseline labs, administer pain medication and CT renal stone study.  Amount and/or Complexity of Data Reviewed External Data Reviewed: notes.    Details: Prior medical  records reviewed by me.  Patient has not been evaluated by urology within the last 5 years Labs: ordered.    Details: Labs show no leukocytosis, hemoglobin unremarkable.  No electrolyte derangement, lipase unremarkable, urinalysis without evidence of infection or hematuria. Radiology: ordered.    Details: CT renal stone study ordered and shows 3 mm stone within the mid aspect of the right ureter without hydronephrosis.  Large colonic stool burden also noted.  Without evidence of obstruction.  There is mild prostatomegaly with mass effect upon the undersurface of the urinary bladder.  Risk Prescription drug management.   Discussed findings with the patient.  On further questioning of prostate symptoms, patient does endorse frequent nocturia.  Denies any difficulty with voiding or feeling of incomplete emptying.  He has not seen urology in 5 years.  I have discussed importance of close urologic follow-up as he will likely need further evaluation of his enlarged prostate as well as his current ureterolithiasis  Patient has been given IV pain medication.  On recheck, he reports feeling better.  No nausea or  vomiting during ER stay.  I feel that he is appropriate for discharge home.  I will provide follow-up information for local urology, tamsulosin and short course of pain medication.  Patient agreeable to care plan and return precautions were discussed.        Final Clinical Impression(s) / ED Diagnoses Final diagnoses:  Kidney stone  Enlarged prostate without urinary obstruction  Constipation, unspecified constipation type    Rx / DC Orders ED Discharge Orders     None         Kem Parkinson, PA-C 11/04/21 1657    Noemi Chapel, MD 11/05/21 [phone]

## 2021-11-04 NOTE — ED Triage Notes (Signed)
Patient states right flank pain with hx of kidney stones for about 4 days. Denies GU symptoms ?

## 2021-11-22 MED ORDER — TIRZEPATIDE 10 MG/0.5ML ~~LOC~~ SOAJ: 10.0000 mg | SUBCUTANEOUS | 1 refills | Status: AC

## 2021-11-22 NOTE — Addendum Note (Signed)
Addended by: Karren Cobble on: 11/22/2021 08:12 AM ? ? Modules accepted: Orders ? ?

## 2022-04-11 ENCOUNTER — Encounter (INDEPENDENT_AMBULATORY_CARE_PROVIDER_SITE_OTHER): Payer: Self-pay
